# Patient Record
Sex: Female | Born: 1985 | Race: Black or African American | Hispanic: No | Marital: Married | Smoking: Former smoker
Health system: Southern US, Community
[De-identification: ages and names within clinical notes are randomized; demographics above are authoritative.]

## PROBLEM LIST (undated history)

## (undated) ENCOUNTER — Inpatient Hospital Stay (HOSPITAL_COMMUNITY): Payer: Self-pay

## (undated) DIAGNOSIS — N390 Urinary tract infection, site not specified: Secondary | ICD-10-CM

## (undated) DIAGNOSIS — R011 Cardiac murmur, unspecified: Secondary | ICD-10-CM

## (undated) DIAGNOSIS — E559 Vitamin D deficiency, unspecified: Secondary | ICD-10-CM

## (undated) DIAGNOSIS — A749 Chlamydial infection, unspecified: Secondary | ICD-10-CM

## (undated) HISTORY — PX: SALPINGECTOMY: SHX328

---
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
## 2006-01-11 ENCOUNTER — Ambulatory Visit (HOSPITAL_COMMUNITY): Admission: RE | Admit: 2006-01-11 | Discharge: 2006-01-11 | Payer: Self-pay | Admitting: Family Medicine

## 2006-01-18 ENCOUNTER — Ambulatory Visit (HOSPITAL_COMMUNITY): Admission: RE | Admit: 2006-01-18 | Discharge: 2006-01-18 | Payer: Self-pay | Admitting: Family Medicine

## 2006-05-20 ENCOUNTER — Inpatient Hospital Stay (HOSPITAL_COMMUNITY): Admission: AD | Admit: 2006-05-20 | Discharge: 2006-05-20 | Payer: Self-pay | Admitting: Obstetrics and Gynecology

## 2006-06-17 ENCOUNTER — Inpatient Hospital Stay (HOSPITAL_COMMUNITY): Admission: AD | Admit: 2006-06-17 | Discharge: 2006-06-17 | Payer: Self-pay | Admitting: Obstetrics and Gynecology

## 2006-08-06 ENCOUNTER — Inpatient Hospital Stay (HOSPITAL_COMMUNITY): Admission: AD | Admit: 2006-08-06 | Discharge: 2006-08-06 | Payer: Self-pay | Admitting: Obstetrics and Gynecology

## 2006-08-10 ENCOUNTER — Inpatient Hospital Stay (HOSPITAL_COMMUNITY): Admission: AD | Admit: 2006-08-10 | Discharge: 2006-08-10 | Payer: Self-pay | Admitting: Obstetrics and Gynecology

## 2006-08-12 ENCOUNTER — Inpatient Hospital Stay (HOSPITAL_COMMUNITY): Admission: AD | Admit: 2006-08-12 | Discharge: 2006-08-12 | Payer: Self-pay | Admitting: Obstetrics and Gynecology

## 2006-08-23 ENCOUNTER — Inpatient Hospital Stay (HOSPITAL_COMMUNITY): Admission: AD | Admit: 2006-08-23 | Discharge: 2006-08-24 | Payer: Self-pay | Admitting: Obstetrics and Gynecology

## 2006-09-04 ENCOUNTER — Inpatient Hospital Stay (HOSPITAL_COMMUNITY): Admission: AD | Admit: 2006-09-04 | Discharge: 2006-09-08 | Payer: Self-pay | Admitting: Obstetrics and Gynecology

## 2006-09-05 ENCOUNTER — Encounter (INDEPENDENT_AMBULATORY_CARE_PROVIDER_SITE_OTHER): Payer: Self-pay | Admitting: Specialist

## 2010-04-29 ENCOUNTER — Inpatient Hospital Stay (HOSPITAL_COMMUNITY): Admission: AD | Admit: 2010-04-29 | Discharge: 2010-04-29 | Payer: Self-pay | Admitting: Obstetrics & Gynecology

## 2010-04-29 ENCOUNTER — Ambulatory Visit: Payer: Self-pay | Admitting: Nurse Practitioner

## 2010-07-08 ENCOUNTER — Ambulatory Visit: Payer: Self-pay | Admitting: Nurse Practitioner

## 2010-07-08 ENCOUNTER — Inpatient Hospital Stay (HOSPITAL_COMMUNITY): Admission: AD | Admit: 2010-07-08 | Discharge: 2010-07-08 | Payer: Self-pay | Admitting: Family Medicine

## 2010-09-03 ENCOUNTER — Ambulatory Visit: Payer: Self-pay | Admitting: Obstetrics and Gynecology

## 2010-09-03 ENCOUNTER — Inpatient Hospital Stay (HOSPITAL_COMMUNITY): Admission: AD | Admit: 2010-09-03 | Discharge: 2010-09-03 | Payer: Self-pay | Admitting: Obstetrics & Gynecology

## 2010-12-24 HISTORY — PX: LAPAROSCOPY FOR ECTOPIC PREGNANCY: SUR765

## 2010-12-31 ENCOUNTER — Inpatient Hospital Stay (HOSPITAL_COMMUNITY)
Admission: AD | Admit: 2010-12-31 | Discharge: 2010-12-31 | Disposition: A | Discharge status: Home / Self Care | Payer: Self-pay | Source: Ambulatory Visit | Attending: Obstetrics and Gynecology | Admitting: Obstetrics and Gynecology

## 2010-12-31 ENCOUNTER — Inpatient Hospital Stay (HOSPITAL_COMMUNITY): Payer: Medicaid Other

## 2010-12-31 ENCOUNTER — Inpatient Hospital Stay (HOSPITAL_COMMUNITY): Admit: 2010-12-31 | Payer: Self-pay | Admitting: Obstetrics & Gynecology

## 2010-12-31 ENCOUNTER — Encounter (HOSPITAL_COMMUNITY): Payer: Self-pay | Admitting: Radiology

## 2010-12-31 DIAGNOSIS — R109 Unspecified abdominal pain: Secondary | ICD-10-CM | POA: Insufficient documentation

## 2010-12-31 DIAGNOSIS — N390 Urinary tract infection, site not specified: Secondary | ICD-10-CM | POA: Insufficient documentation

## 2010-12-31 DIAGNOSIS — O2390 Unspecified genitourinary tract infection in pregnancy, unspecified trimester: Secondary | ICD-10-CM

## 2010-12-31 LAB — URINALYSIS, ROUTINE W REFLEX MICROSCOPIC
Hgb urine dipstick: NEGATIVE
Leukocytes, UA: NEGATIVE
Protein, ur: NEGATIVE mg/dL
Specific Gravity, Urine: 1.01 (ref 1.005–1.030)
Urobilinogen, UA: 0.2 mg/dL (ref 0.0–1.0)

## 2010-12-31 LAB — CBC
HCT: 39.6 % (ref 36.0–46.0)
Hemoglobin: 13 g/dL (ref 12.0–15.0)
MCH: 30.6 pg (ref 26.0–34.0)
MCHC: 32.8 g/dL (ref 30.0–36.0)
MCV: 93.2 fL (ref 78.0–100.0)
RDW: 12.5 % (ref 11.5–15.5)

## 2010-12-31 LAB — WET PREP, GENITAL

## 2010-12-31 LAB — URINE MICROSCOPIC-ADD ON

## 2011-01-01 LAB — GC/CHLAMYDIA PROBE AMP, GENITAL
Chlamydia, DNA Probe: NEGATIVE
GC Probe Amp, Genital: NEGATIVE

## 2011-01-02 ENCOUNTER — Inpatient Hospital Stay (HOSPITAL_COMMUNITY)
Admission: AD | Admit: 2011-01-02 | Discharge: 2011-01-02 | Disposition: A | Discharge status: Home / Self Care | Payer: Self-pay | Source: Ambulatory Visit | Attending: Obstetrics and Gynecology | Admitting: Obstetrics and Gynecology

## 2011-01-02 DIAGNOSIS — R109 Unspecified abdominal pain: Secondary | ICD-10-CM

## 2011-01-02 DIAGNOSIS — O9989 Other specified diseases and conditions complicating pregnancy, childbirth and the puerperium: Secondary | ICD-10-CM

## 2011-01-02 DIAGNOSIS — O99891 Other specified diseases and conditions complicating pregnancy: Secondary | ICD-10-CM | POA: Insufficient documentation

## 2011-01-02 LAB — URINE CULTURE

## 2011-01-09 ENCOUNTER — Ambulatory Visit (HOSPITAL_COMMUNITY)
Admit: 2011-01-09 | Discharge: 2011-01-09 | Disposition: A | Discharge status: Home / Self Care | Payer: Medicaid Other | Attending: Obstetrics and Gynecology | Admitting: Obstetrics and Gynecology

## 2011-01-09 ENCOUNTER — Other Ambulatory Visit: Payer: Self-pay | Admitting: Obstetrics and Gynecology

## 2011-01-09 ENCOUNTER — Ambulatory Visit (HOSPITAL_COMMUNITY)
Admission: AD | Admit: 2011-01-09 | Discharge: 2011-01-09 | Disposition: A | Discharge status: Home / Self Care | Payer: Self-pay | Source: Ambulatory Visit | Attending: Obstetrics and Gynecology | Admitting: Obstetrics and Gynecology

## 2011-01-09 DIAGNOSIS — O00109 Unspecified tubal pregnancy without intrauterine pregnancy: Secondary | ICD-10-CM

## 2011-01-09 DIAGNOSIS — Z3689 Encounter for other specified antenatal screening: Secondary | ICD-10-CM | POA: Insufficient documentation

## 2011-01-09 LAB — CBC
HCT: 38.5 % (ref 36.0–46.0)
Hemoglobin: 12.4 g/dL (ref 12.0–15.0)
MCH: 30.1 pg (ref 26.0–34.0)
MCV: 93.4 fL (ref 78.0–100.0)
Platelets: 232 10*3/uL (ref 150–400)
RBC: 4.12 MIL/uL (ref 3.87–5.11)
WBC: 5 10*3/uL (ref 4.0–10.5)

## 2011-01-09 LAB — HCG, QUANTITATIVE, PREGNANCY: hCG, Beta Chain, Quant, S: 11906 m[IU]/mL — ABNORMAL HIGH (ref <5)

## 2011-01-09 LAB — TYPE AND SCREEN

## 2011-01-12 LAB — RH IMMUNE GLOB WKUP(>/=20WKS)(NOT WOMEN'S HOSP)

## 2011-01-20 NOTE — Op Note (Signed)
[name], [name]             ACCOUNT NO.:  [id number]  MEDICAL RECORD NO.:  [id number]           PATIENT TYPE:  LOCATION:                                 FACILITY:  PHYSICIAN:  Horton Chin, MD DATE OF BIRTH:  27-Oct-1986  DATE OF PROCEDURE:  01/09/2011 DATE OF DISCHARGE:                              OPERATIVE REPORT   PREOPERATIVE DIAGNOSIS:  Viable tubal ectopic pregnancy at 6 weeks' gestation.  POSTOPERATIVE DIAGNOSIS:  Viable tubal ectopic pregnancy at 6 weeks' gestation.  PROCEDURE:  Laparoscopic right salpingectomy and removal of ectopic pregnancy.  SURGEON:  Horton Chin, MD  ANESTHESIOLOGIST:  Angelica Pou, MD  ANESTHESIA:  General.  IV FLUIDS:  1800 mL of lactated Ringer.  ESTIMATED BLOOD LOSS:  Minimal.  URINE OUTPUT:  400 mL.  INDICATIONS:  The patient is a 26 year old gravida 2, para 1-0-0-2, who has been followed for abdominal pain in early pregnancy.  The patient had normally rising beta-hCG trend, but a followup ultrasound that was scheduled today showed a 6-week sized tubal ectopic pregnancy with positive cardiac activity.  No intrauterine pregnancy was visualized. Given this finding, the patient was counseled regarding needing laparoscopic removal of the ectopic pregnancy.  The risks of the procedure were explained to the patient including but not limited to: bleeding, infection, injury to surrounding organs, need for additional procedures including laparotomy, increased risk of ectopic gestation without pregnancies, and a written informed consent was obtained.  FINDINGS:  Right fallopian tube ectopic pregnancy.  Normal left adnexa and uterus.  SPECIMENS:  Right fallopian tube containing ectopic pregnancy.  DISPOSITION OF SPECIMENS:  To pathology.  COMPLICATIONS:  None immediate.  PROCEDURE DETAILS:  The patient was taken to the operating room where general analgesia was administered and found to be adequate.  She  also had sequential compression devices applied to her lower extremities prior to induction of anesthesia.  After the anesthesia was found to be adequate, she was placed in the dorsal lithotomy position and prepped and draped in a sterile manner.  A Foley catheter was inserted into the patient's bladder and attached to constant gravity and a Hulka uterine manipulator was placed.  After an adequate time-out was performed, attention was turned to the patient's abdomen where an umbilical incision was made with a scalpel and a Veress needle was introduced into the peritoneal cavity.  Correct intraperitoneal placement was confirmed with a low opening pressure of 2 mmHg upon insufflation with carbon dioxide gas.  The abdomen was insufflated to 15 mmHg.  The Veress needle was removed, and an 11-mm trocar was then introduced through the umbilical incision.  A laparoscope was then used at this point to survey the abdomen and the pelvis. The upper abdomen was also surveyed, found to be normal, and the pelvis was significant for the right-sided ectopic pregnancy which was noted.  This ectopic pregnancy was noted to be close to the cornu on the right side and she had a normal left fallopian tube and normal ovaries bilaterally.  At this point, bilateral lower quadrant ports were placed, which were both 5-mm ports, and using  Gyrus instrument, the  right fallopian tube was coagulated and ligated from the uterine attachment and also from the underlying mesosalpinx. An EndoCatch bag was then placed and this was used to remove the tube from the abdomen.  There was good hemostasis.  The abdomen was then desufflated and all trocars were removed.  The umbilical incision fascia was reapproximated using 0 Vicryl and the fascial incision of the lower quadrant ports were reapproximated with 0 Vicryl.  All skin incisions were closed with Dermabond.  The patient tolerated the procedure well.  Sponge, instrument,  and needle counts were correct x2. She was taken to the recovery room awake, extubated, and in stable condition.  DISPOSITION:  The patient has been scheduled to follow up in the gynecologic clinic on January 23, 2011, at 9:30 a.m.  She was given routine laparoscopy discharge instructions and was also given a note for work to be excused from work until January 19, 2011, as per the patient's request.  The patient was also given prescriptions for Percocet, ibuprofen, and Colac, 30 tablets each, and also a prescription for ciprofloxacin 500 mg p.o. b.i.d. for 3 days as a culture that was done on one of the patient's prior MAU visits was positive for over 100,000 of E. coli that was pansensitive and also very sensitive to fluoroquinolones.  The patient was advised on how to take these medications and encouraged to finish her antibiotic regimen.  She was told to call the clinic or come to the MAU with any further concerns.     Horton Chin, MD     UAA/MEDQ  D:  01/09/2011  T:  01/10/2011  Job:  [id number]  Electronically Signed by Jaynie Collins MD on 01/20/2011 06:33:18 PM

## 2011-01-23 ENCOUNTER — Encounter: Payer: Medicaid Other | Admitting: Obstetrics & Gynecology

## 2011-02-04 LAB — INFLUENZA PANEL BY PCR (TYPE A & B)
Influenza A By PCR: NEGATIVE
Influenza B By PCR: NEGATIVE

## 2011-02-06 LAB — WET PREP, GENITAL
Trich, Wet Prep: NONE SEEN
Yeast Wet Prep HPF POC: NONE SEEN

## 2011-02-06 LAB — URINALYSIS, ROUTINE W REFLEX MICROSCOPIC
Bilirubin Urine: NEGATIVE
Nitrite: NEGATIVE
Protein, ur: NEGATIVE mg/dL
Urobilinogen, UA: 0.2 mg/dL (ref 0.0–1.0)

## 2011-02-06 LAB — POCT PREGNANCY, URINE: Preg Test, Ur: NEGATIVE

## 2011-02-06 LAB — GC/CHLAMYDIA PROBE AMP, GENITAL
Chlamydia, DNA Probe: NEGATIVE
GC Probe Amp, Genital: NEGATIVE

## 2011-02-09 LAB — URINALYSIS, ROUTINE W REFLEX MICROSCOPIC
Nitrite: NEGATIVE
Specific Gravity, Urine: 1.02 (ref 1.005–1.030)
pH: 7.5 (ref 5.0–8.0)

## 2011-02-09 LAB — URINE CULTURE

## 2011-02-09 LAB — GC/CHLAMYDIA PROBE AMP, GENITAL
Chlamydia, DNA Probe: NEGATIVE
GC Probe Amp, Genital: NEGATIVE

## 2011-02-09 LAB — POCT PREGNANCY, URINE: Preg Test, Ur: NEGATIVE

## 2011-02-26 ENCOUNTER — Encounter: Payer: Medicaid Other | Admitting: Obstetrics & Gynecology

## 2011-03-20 ENCOUNTER — Emergency Department (HOSPITAL_COMMUNITY)
Admission: EM | Admit: 2011-03-20 | Discharge: 2011-03-20 | Disposition: A | Discharge status: Home / Self Care | Payer: Medicaid Other | Attending: Emergency Medicine | Admitting: Emergency Medicine

## 2011-03-20 DIAGNOSIS — R51 Headache: Secondary | ICD-10-CM | POA: Insufficient documentation

## 2011-03-20 DIAGNOSIS — R599 Enlarged lymph nodes, unspecified: Secondary | ICD-10-CM | POA: Insufficient documentation

## 2011-03-20 DIAGNOSIS — J029 Acute pharyngitis, unspecified: Secondary | ICD-10-CM | POA: Insufficient documentation

## 2011-03-20 DIAGNOSIS — J3489 Other specified disorders of nose and nasal sinuses: Secondary | ICD-10-CM | POA: Insufficient documentation

## 2011-03-20 LAB — RAPID STREP SCREEN (MED CTR MEBANE ONLY): Streptococcus, Group A Screen (Direct): NEGATIVE

## 2011-04-10 NOTE — Op Note (Signed)
[name], [name]             ACCOUNT NO.:  [id number]   MEDICAL RECORD NO.:  [id number]          PATIENT TYPE:  INP   LOCATION:  9130                          [facility]ILITY:  [facility]   PHYSICIAN:  Janine Limbo, M.D.DATE OF BIRTH:  1986-01-26   DATE OF PROCEDURE:  DATE OF DISCHARGE:                                 OPERATIVE REPORT   PREOPERATIVE DIAGNOSES:  1. Twin gestation.  2. 38 weeks' gestation.  3. Failure to progress in labor.   POSTOPERATIVE DIAGNOSES:  1. Twin gestation.  2. 38 weeks' gestation.  3. Failure to progress in labor.  4. Occiput posterior presentation.   PROCEDURE:  Primary low transverse cesarean section.   SURGEON:  Dr. Leonard Schwartz.   FIRST ASSISTANT:  [name], certified nurse midwife.   ANESTHETIC:  Epidural.   DISPOSITION:  [name] is a 25 year old female, gravida 1, para 0, who  was admitted on 09/04/2006 with a twin gestation.  She was at [redacted] weeks  gestation.  Her labor did not progress on first day of induction and then  her membranes were ruptured on the morning of delivery when she was 3 cm  dilated.  She had contractions throughout the day but did not progress her  cervix.  After discussing options for a third day of induction the patient  elected to proceed with cesarean section.  The risk and benefits of each of  the options were reviewed with the patient prior to her making her decision  to proceed with cesarean section.  The specific risks of cesarean section  were outlined including, but not limited to, anesthetic complications,  bleeding, infection, and possible damage to surrounding organs.   FINDINGS:  The first infant delivered was a female that weighed 6 pounds 13  ounces.  His name is [name].  He was in an occiput posterior presentation.  The Apgars were 8 at one minute and 9 at five minutes.  The second infant  delivered was a female that was delivered from an occiput anterior  presentation.  He was 6 pounds 3  ounces boy named [name].  The apparatus for  8 at one minute and 9 at five minutes.  The uterus, fallopian tubes, and  ovaries were normal for the gravid state.   PROCEDURE:  The patient was taken to the operating room where it was  determined that the epidural she had received for labor would be adequate  for cesarean section.  The patient had previously had a Foley catheter  placed.  The patient's abdomen was prepped with multiple layers of Betadine  and then sterilely draped.  The lower abdomen was injected with 10 mL of  half percent Marcaine with epinephrine.  A low transverse incision was made  in the abdomen and carried sharply through the subcutaneous tissue, fascia,  and the anterior peritoneum.  The bladder flap was developed.  An incision  was made in the lower uterine segment and extended in a low transverse  fashion.  The first fetal head was delivered without difficulty.  The mouth  and nose were suctioned.  The remainder of  the infant was delivered.  The  cord was clamped and cut and infant was handed to the awaiting pediatric  team.  Routine cord blood studies were obtained.  An identical procedure was  carried out for the second infant.  The cord was clamped and cut and the  infant was handed to the waiting pediatric team.  The placenta was removed.  The uterus was cleaned of amniotic fluid, clotted blood, and membranes.  The  uterine incision was then closed using a running locking suture of 2-0  Vicryl followed by an imbricating suture of 2-0 Vicryl.  Hemostasis was  adequate.  The pelvis was vigorously irrigated.  The anterior peritoneum and  abdominal musculature were reapproximated in the midline using 2-0 Vicryl.  The fascia was closed using a running suture of 0 Vicryl followed by three  interrupted sutures of 0 Vicryl.  The subcutaneous layer was irrigated.  The  subcutaneous layer was closed using a running suture of 0 Vicryl.  The skin  was reapproximated using a  subcuticular suture of 3-0 Monocryl.  Sponge,  needle, instrument counts were correct on two occasions.  Estimated blood  loss for the procedure was 800 mL.  The patient tolerated her procedure  well.  She was taken to the recovery room in stable condition.  Both infants  were taken to the full-term nursery in stable condition.  The placenta was  sent to pathology for evaluation.      Janine Limbo, M.D.  Electronically Signed     AVS/MEDQ  D:  09/05/2006  T:  09/06/2006  Job:  [id number]
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
## 2011-04-10 NOTE — Discharge Summary (Signed)
[name], [name]             ACCOUNT NO.:  [id number]   MEDICAL RECORD NO.:  [id number]          PATIENT TYPE:  INP   LOCATION:  9130                          FACILITY:  [facility]   PHYSICIAN:  Hal Morales, M.D.DATE OF BIRTH:  1986-11-08   DATE OF ADMISSION:  09/04/2006  DATE OF DISCHARGE:  09/08/2006                                 DISCHARGE SUMMARY   ADMITTING DIAGNOSIS:  Twin intrauterine pregnancy at 38-6/7 week.   DISCHARGE DIAGNOSES:  1. Twin intrauterine pregnancy at 39 weeks delivered.  2. Primary low transverse cesarean section.  3. Postoperative anemia without evidence of hemodynamic instability.   PROCEDURES:  Primary low transverse cesarean section.   HOSPITAL COURSE:  [name] is a 25 year old gravida 1, para 0 who was  admitted at 38-6/[redacted] weeks gestation with twin pregnancy for induction of  labor. The patient's pregnancy remarkable for:   1. Adolescence.  2. Twin.  3. Patient with a history of a heart murmur.  4. Positive Chlamydia first trimester with negative test of cure.  5. Rh negative.  6. Group B strep negative.   The patient's labor was induced. High dose of Pitocin protocol was started  on the day of admission and was continued throughout the day of admission.  On the evening of October 13, the day of admission, Pitocin was discontinued  and Cytotec was placed for cervical ripening. The following morning on  October 14 Pitocin was resumed. The patient with small amount of cervical  change noted. Patient with minimal cervical change throughout the day of  September 05, 2006. At that time the patient requested primary low transverse  cesarean section due to no cervical change. At that time the risks,  benefits, and alternatives of cesarean section were discussed with the  patient by  Dr. Stefano Gaul, and she elected to proceed. The patient underwent a primary  low transverse cesarean section and delivered twin infants. Twin A 6 pounds  13 ounces, a  female, [name], with Apgars of 8 at 1 minute and 9 at 5 minutes.  Twin B 6 pounds 3 ounces, female infant [name], with Apgars of 8 at 1 minute  and 9 at 5 minutes. Estimated blood loss 800 cc. The patient is breast  feeding. Hemoglobin was 5.6 on postoperative day #1. However, patient was  asymptomatic with no evidence of hemodynamic instability. The patient was  started on iron supplementation on postoperative day #1. The patient with  orthostatic vital signs without abnormality on postoperative day #1. The  risks, benefits, and alternatives of blood transfusion were discussed with  the patient, and she declined blood transfusion. Repeat blood count obtained  on postoperative day #1 at mid afternoon and hemoglobin stable at 5.6. On  the evening of postoperative day #2 the patient experienced episodes of  chest tightness but no distinct shortness of breath. The patient evaluated  with EKG which was within normal limits as well as cardiac enzymes which  were within normal limits. It was felt that perhaps this episode was due  more to anxiety as the patient was tearful and stated that she felt  overwhelmed with the care of twins. On postoperative day #3 the patient  denied any further chest tightness or shortness of breath. The patient also  continued to deny syncopal symptoms. It was felt that she had received the  full benefit of her hospital stay and discharged home. The patient plans use  of Implanon for contraception after her six week visit.   DISCHARGE CONDITION:  Stable.   DISCHARGE INSTRUCTIONS:  Per [facility] handout.   DISCHARGE MEDICATIONS:  1. Motrin 600 mg p.o. q.6 h. p.r.n. pain.  2. Tylox 1-2 tabs p.o. q. 4-6 hours p.r.n. pain.  3. Prenatal vitamins one tablet daily.  4. Tandem F one tablet twice daily.   DISCHARGE FOLLOWUP:  Will occur at six weeks postpartum at [facility]  OB/GYN.      Rhona Leavens, CNM      Hal Morales, M.D.   Electronically Signed    NOS/MEDQ  D:  09/08/2006  T:  09/09/2006  Job:  [id number]

## 2011-04-23 ENCOUNTER — Inpatient Hospital Stay (HOSPITAL_COMMUNITY)
Admission: AD | Admit: 2011-04-23 | Discharge: 2011-04-23 | Disposition: A | Discharge status: Home / Self Care | Payer: Medicaid Other | Source: Ambulatory Visit | Attending: Obstetrics and Gynecology | Admitting: Obstetrics and Gynecology

## 2011-04-23 DIAGNOSIS — N949 Unspecified condition associated with female genital organs and menstrual cycle: Secondary | ICD-10-CM

## 2011-04-23 DIAGNOSIS — R109 Unspecified abdominal pain: Secondary | ICD-10-CM | POA: Insufficient documentation

## 2011-04-23 LAB — CBC
Hemoglobin: 12.5 g/dL (ref 12.0–15.0)
MCH: 30.6 pg (ref 26.0–34.0)
RBC: 4.08 MIL/uL (ref 3.87–5.11)

## 2011-04-23 LAB — URINALYSIS, ROUTINE W REFLEX MICROSCOPIC
Bilirubin Urine: NEGATIVE
Ketones, ur: NEGATIVE mg/dL
Nitrite: NEGATIVE
Protein, ur: NEGATIVE mg/dL
pH: 7 (ref 5.0–8.0)

## 2011-04-23 LAB — POCT PREGNANCY, URINE: Preg Test, Ur: NEGATIVE

## 2011-04-23 LAB — WET PREP, GENITAL

## 2011-04-23 LAB — HCG, QUANTITATIVE, PREGNANCY: hCG, Beta Chain, Quant, S: 1 m[IU]/mL (ref <5)

## 2011-04-24 LAB — GC/CHLAMYDIA PROBE AMP, GENITAL: GC Probe Amp, Genital: NEGATIVE

## 2011-11-05 ENCOUNTER — Encounter (HOSPITAL_COMMUNITY): Payer: Self-pay | Admitting: *Deleted

## 2011-11-05 ENCOUNTER — Inpatient Hospital Stay (HOSPITAL_COMMUNITY)
Admission: AD | Admit: 2011-11-05 | Discharge: 2011-11-06 | Disposition: A | Discharge status: Home / Self Care | Payer: Medicaid Other | Source: Ambulatory Visit | Attending: Obstetrics & Gynecology | Admitting: Obstetrics & Gynecology

## 2011-11-05 DIAGNOSIS — R109 Unspecified abdominal pain: Secondary | ICD-10-CM | POA: Insufficient documentation

## 2011-11-05 LAB — URINALYSIS, ROUTINE W REFLEX MICROSCOPIC
Glucose, UA: NEGATIVE mg/dL
Leukocytes, UA: NEGATIVE
Protein, ur: NEGATIVE mg/dL
Urobilinogen, UA: 1 mg/dL (ref 0.0–1.0)

## 2011-11-05 LAB — POCT PREGNANCY, URINE: Preg Test, Ur: NEGATIVE

## 2011-11-05 NOTE — Progress Notes (Signed)
Not in lobby

## 2011-11-05 NOTE — Progress Notes (Signed)
Pt not in lobby.  

## 2011-11-05 NOTE — Progress Notes (Signed)
PT SAYS HAS HX OF ECTOPIC- - 12-2010  .-  HAD SURGERY- REMOVED R TUBE - SHE THINKS NO BIRTH CONTROL.  DID UPT - TODAY  X2 - POST.    SAYS HER PAIN IS  ALL OVER ABD - LIKE MENST  CRAMP.Marland Kitchen TWINS WERE DEL BY [facility]

## 2011-11-06 ENCOUNTER — Inpatient Hospital Stay (HOSPITAL_COMMUNITY): Payer: Medicaid Other

## 2011-11-06 ENCOUNTER — Encounter (HOSPITAL_COMMUNITY): Payer: Self-pay | Admitting: *Deleted

## 2011-11-06 ENCOUNTER — Inpatient Hospital Stay (HOSPITAL_COMMUNITY)
Admission: AD | Admit: 2011-11-06 | Discharge: 2011-11-06 | Disposition: A | Discharge status: Home / Self Care | Payer: Medicaid Other | Source: Ambulatory Visit | Attending: Obstetrics and Gynecology | Admitting: Obstetrics and Gynecology

## 2011-11-06 DIAGNOSIS — R109 Unspecified abdominal pain: Secondary | ICD-10-CM | POA: Insufficient documentation

## 2011-11-06 DIAGNOSIS — Z3201 Encounter for pregnancy test, result positive: Secondary | ICD-10-CM

## 2011-11-06 DIAGNOSIS — O200 Threatened abortion: Secondary | ICD-10-CM

## 2011-11-06 DIAGNOSIS — O99891 Other specified diseases and conditions complicating pregnancy: Secondary | ICD-10-CM | POA: Insufficient documentation

## 2011-11-06 HISTORY — DX: Urinary tract infection, site not specified: N39.0

## 2011-11-06 HISTORY — DX: Chlamydial infection, unspecified: A74.9

## 2011-11-06 HISTORY — DX: Cardiac murmur, unspecified: R01.1

## 2011-11-06 LAB — WET PREP, GENITAL: Trich, Wet Prep: NONE SEEN

## 2011-11-06 NOTE — ED Provider Notes (Signed)
History     Chief Complaint  Patient presents with  . Dysmenorrhea   HPI This is a 25 y.o. at Unknown [place] who presents for evaluation of pregnancy. States had an ectopic (with right salpingectomy) in February 2012 (twins last year) and was due for her period yesterday. Developed cramping all over abdomen and feels like heart is beating out of her chest at times.  OB History    Grav Para Term Preterm Abortions TAB SAB Ect Mult Living   3 1 1  0 1 0 0 1 1 2       Past Medical History  Diagnosis Date  . Heart murmur   . Urinary tract infection   . Chlamydia ?2007    Past Surgical History  Procedure Date  . Cesarean section   . Laparoscopy for ectopic pregnancy Feb 2012    Family History  Problem Relation Age of Onset  . Anesthesia problems Neg Hx     History  Substance Use Topics  . Smoking status: Former Games developer  . Smokeless tobacco: Never Used  . Alcohol Use: No    Allergies: No Known Allergies  No prescriptions prior to admission    ROS See above  Physical Exam   Blood pressure 109/56, pulse 78, temperature 98.7 F (37.1 C), temperature source Oral, resp. rate 18, height 5\' 1"  (1.549 m), weight 121 lb 4 oz (54.999 kg), last menstrual period 10/09/2011, SpO2 99.00%, not currently breastfeeding.  Physical Exam  Constitutional: She is oriented to person, place, and time. She appears well-developed and well-nourished. No distress.  HENT:  Head: Normocephalic.  Cardiovascular: Normal rate.   Respiratory: Effort normal.  GI: Soft. She exhibits no distension. There is no tenderness.  Genitourinary: Vagina normal and uterus normal. No vaginal discharge found.  Musculoskeletal: Normal range of motion.  Neurological: She is alert and oriented to person, place, and time.  Skin: Skin is warm and dry.  Psychiatric: She has a normal mood and affect.   Quant reviewed Results for orders placed during the hospital encounter of 11/06/11 (from the past 24 hour(s))  HCG,  QUANTITATIVE, PREGNANCY     Status: Abnormal   Collection Time   11/06/11  8:33 AM      Component Value Range   hCG, Beta Chain, Quant, S 484 (*) <5 (mIU/mL)   Quant TODAY:  1259  [place] reviewed Thickened endometrium. No IUGS seen No fetal pole or Yolk sac. R corpus Luteum cyst  MAU Course  Procedures  Assessment and Plan  A:  Pregnancy      Undetermined whether normal or abnormal P:  Repeat Quant on 12.16/12   [facility] 11/06/2011, 10:13 AM

## 2011-11-06 NOTE — Progress Notes (Signed)
NOT IN LOBBY 

## 2011-11-06 NOTE — Progress Notes (Signed)
Was to get period yesterday.  Started cramping a couple days before.  Has hx of ectopic preg and was concerned.  Hx of heart murmur, has been feeling different lately, like heart is "beating out of chest"  When walking, short of breath, and light headed at times

## 2011-11-06 NOTE — Progress Notes (Signed)
Pt states got up yesterday to get the door, and felt very faint, started shaking, had to sit down. Hx heart murmur. Also having lower abd cramping, rates 3/10. LMP 10/09/2011. Denies bleeding or vag d/c.

## 2011-11-07 LAB — GC/CHLAMYDIA PROBE AMP, GENITAL
Chlamydia, DNA Probe: NEGATIVE
GC Probe Amp, Genital: NEGATIVE

## 2011-11-08 ENCOUNTER — Inpatient Hospital Stay (HOSPITAL_COMMUNITY)
Admission: AD | Admit: 2011-11-08 | Discharge: 2011-11-08 | Disposition: A | Discharge status: Home / Self Care | Payer: Medicaid Other | Source: Ambulatory Visit | Attending: Family Medicine | Admitting: Family Medicine

## 2011-11-08 DIAGNOSIS — O209 Hemorrhage in early pregnancy, unspecified: Secondary | ICD-10-CM | POA: Insufficient documentation

## 2011-11-08 DIAGNOSIS — O200 Threatened abortion: Secondary | ICD-10-CM

## 2011-11-08 MED ORDER — CONCEPT OB 130-92.4-1 MG PO CAPS: 1.0000 | ORAL_CAPSULE | Freq: Every day | ORAL | Status: DC

## 2011-11-08 NOTE — Progress Notes (Signed)
Denies pain or bleeding here for repeat BHCG

## 2011-11-08 NOTE — ED Provider Notes (Signed)
History   Chief Complaint:  Possible Pregnancy   [name] is  25 y.o. [id number] Patient's last menstrual period was 10/09/2011.Marland Kitchen Patient is here for follow up of quantitative HCG and ongoing surveillance of pregnancy status.   She is 4.[redacted]  weeks gestation  by LMP.    Since her last visit, the patient is without new complaint.   The patient reports bleeding as  none now.    General ROS:  negative  Her previous Quantitative HCG values are: 11/06/11 484    Physical Exam   Blood pressure 111/52, pulse 85, temperature 98.4 F (36.9 C), resp. rate 16, last menstrual period 10/09/2011, not currently breastfeeding.  Focused Gynecological Exam: examination not indicated  Labs: Recent Results (from the past 24 hour(s))  HCG, QUANTITATIVE, PREGNANCY   Collection Time   11/08/11 12:27 PM      Component Value Range   hCG, Beta Chain, Quant, S 1259 (*) <5 (mIU/mL)    Ultrasound Studies:    Assessment: Early pregnancy w/ appropriately rising quants. Hx ectpoic  Plan: Repeat quant in 2 days.  Ectopic precautions  Braelen Sproule 11/08/2011, 1:35 PM

## 2011-11-10 ENCOUNTER — Encounter (HOSPITAL_COMMUNITY): Payer: Self-pay

## 2011-11-10 ENCOUNTER — Inpatient Hospital Stay (HOSPITAL_COMMUNITY): Payer: Medicaid Other

## 2011-11-10 ENCOUNTER — Inpatient Hospital Stay (HOSPITAL_COMMUNITY)
Admission: AD | Admit: 2011-11-10 | Discharge: 2011-11-10 | Disposition: A | Discharge status: Home / Self Care | Payer: Medicaid Other | Source: Ambulatory Visit | Attending: Obstetrics & Gynecology | Admitting: Obstetrics & Gynecology

## 2011-11-10 DIAGNOSIS — R109 Unspecified abdominal pain: Secondary | ICD-10-CM | POA: Insufficient documentation

## 2011-11-10 DIAGNOSIS — O99891 Other specified diseases and conditions complicating pregnancy: Secondary | ICD-10-CM | POA: Insufficient documentation

## 2011-11-10 DIAGNOSIS — O26899 Other specified pregnancy related conditions, unspecified trimester: Secondary | ICD-10-CM

## 2011-11-10 NOTE — Progress Notes (Signed)
Pt states here for f/u labs only, denies pain or bleeding.

## 2011-11-10 NOTE — ED Provider Notes (Signed)
History   Pt presents today for f/u B-quant secondary to pos preg test and hx of ectopic preg. Pt denies abd pain or bleeding. She has no complaints at this time.  Chief Complaint  Patient presents with  . Labs Only   HPI  OB History    Grav Para Term Preterm Abortions TAB SAB Ect Mult Living   3 1 1  0 1 0 0 1 1 2       Past Medical History  Diagnosis Date  . Heart murmur   . Urinary tract infection   . Chlamydia ?2007    Past Surgical History  Procedure Date  . Cesarean section   . Laparoscopy for ectopic pregnancy Feb 2012  . Salpingectomy     Family History  Problem Relation Age of Onset  . Anesthesia problems Neg Hx     History  Substance Use Topics  . Smoking status: Former Games developer  . Smokeless tobacco: Never Used  . Alcohol Use: No    Allergies: No Known Allergies  Prescriptions prior to admission  Medication Sig Dispense Refill  . Prenat w/o A Vit-FeFum-FePo-FA (CONCEPT OB) 130-92.4-1 MG CAPS Take 1 tablet by mouth daily.  30 capsule  12    Review of Systems  Constitutional: Negative for fever.  Cardiovascular: Negative for chest pain.  Gastrointestinal: Negative for nausea, vomiting, abdominal pain, diarrhea and constipation.  Genitourinary: Negative for dysuria, urgency, frequency and hematuria.  Neurological: Negative for dizziness and headaches.  Psychiatric/Behavioral: Negative for depression and suicidal ideas.   Physical Exam   Pulse 83, temperature 98.7 F (37.1 C), temperature source Oral, resp. rate 18, height 5\' 1"  (1.549 m), weight 122 lb 1 oz (55.367 kg), last menstrual period 10/09/2011, not currently breastfeeding.  Physical Exam  Nursing note and vitals reviewed. Constitutional: She is oriented to person, place, and time. She appears well-developed and well-nourished. No distress.  HENT:  Head: Normocephalic and atraumatic.  Eyes: EOM are normal. Pupils are equal, round, and reactive to light.  GI: Soft. She exhibits no  distension. There is no tenderness. There is no rebound and no guarding.  Neurological: She is alert and oriented to person, place, and time.  Skin: Skin is warm and dry. She is not diaphoretic.  Psychiatric: She has a normal mood and affect. Her behavior is normal. Judgment and thought content normal.    MAU Course  Procedures  Results for orders placed during the hospital encounter of 11/10/11 (from the past 24 hour(s))  HCG, QUANTITATIVE, PREGNANCY     Status: Abnormal   Collection Time   11/10/11  9:31 AM      Component Value Range   hCG, Beta Chain, Quant, S 2468 (*) <5 (mIU/mL)   US shows fluid collection within the uterus. Probable early IUP although ectopic cannot be ruled out.  Assessment and Plan  Pain in preg: no pain now. Pt will return in 2 days for repeat B-quant. Discussed signs and sx of ectopic preg. Discussed diet, activity, risks, and precautions.  Clinton Gallant. Rice III, DrHSc, MPAS, PA-C  11/10/2011, 12:32 PM   Henrietta Hoover, PA 11/10/11 1236

## 2011-11-10 NOTE — ED Provider Notes (Signed)
Attestation of Attending Supervision of Advanced Practitioner: Evaluation and management procedures were performed by the PA/NP/CNM/OB Fellow under my supervision/collaboration. Chart reviewed, and agree with management and plan.  Jaynie Collins, M.D. 11/10/2011 2:16 PM

## 2011-11-10 NOTE — ED Provider Notes (Signed)
Attestation of Attending Supervision of Advanced Practitioner: Evaluation and management procedures were performed by the PA/NP/CNM/OB Fellow under my supervision/collaboration. Chart reviewed, and agree with management and plan.  Jaynie Collins, M.D. 11/10/2011 9:58 AM

## 2011-11-11 ENCOUNTER — Encounter (HOSPITAL_COMMUNITY): Payer: Self-pay | Admitting: *Deleted

## 2011-11-11 ENCOUNTER — Inpatient Hospital Stay (HOSPITAL_COMMUNITY)
Admission: AD | Admit: 2011-11-11 | Discharge: 2011-11-12 | Disposition: A | Discharge status: Home / Self Care | Payer: Medicaid Other | Source: Ambulatory Visit | Attending: Obstetrics and Gynecology | Admitting: Obstetrics and Gynecology

## 2011-11-11 DIAGNOSIS — O209 Hemorrhage in early pregnancy, unspecified: Secondary | ICD-10-CM

## 2011-11-11 DIAGNOSIS — O26859 Spotting complicating pregnancy, unspecified trimester: Secondary | ICD-10-CM | POA: Insufficient documentation

## 2011-11-11 LAB — CBC
HCT: 36.6 % (ref 36.0–46.0)
MCH: 30.4 pg (ref 26.0–34.0)
MCHC: 32.8 g/dL (ref 30.0–36.0)
MCV: 92.7 fL (ref 78.0–100.0)
Platelets: 256 10*3/uL (ref 150–400)
RDW: 12.6 % (ref 11.5–15.5)
WBC: 7.7 10*3/uL (ref 4.0–10.5)

## 2011-11-11 MED ORDER — RHO D IMMUNE GLOBULIN 1500 UNIT/2ML IJ SOLN
300.0000 ug | Freq: Once | INTRAMUSCULAR | Status: AC
  Administered 2011-11-12: 300 ug via INTRAMUSCULAR
  Filled 2011-11-11: qty 2

## 2011-11-11 MED ORDER — DIPHENHYDRAMINE HCL 25 MG PO CAPS: 25.0000 mg | ORAL_CAPSULE | Freq: Once | ORAL | Status: DC

## 2011-11-11 MED ORDER — HYDROMORPHONE HCL PF 1 MG/ML IJ SOLN: 1.0000 mg | Freq: Once | INTRAMUSCULAR | Status: DC

## 2011-11-11 NOTE — Progress Notes (Signed)
Pt presents to mau for c/o light bleeding.  Noticed it on her underwear and then when wiping. No bleeding on underwear here.  Did notice slight pink when wiping in bathroom here.

## 2011-11-11 NOTE — Progress Notes (Signed)
H. Neese, NP at bedside.  Assessment done and poc discussed with pt.  

## 2011-11-11 NOTE — Progress Notes (Signed)
SSE per NP.  VE done.

## 2011-11-11 NOTE — Progress Notes (Signed)
Pt noticed spots of blood on the tissue when she wiped after using the bthroom-no active bleeding-just on the tissue

## 2011-11-11 NOTE — ED Provider Notes (Signed)
[name] is a 25 y.o. female @ 101w5d weeks gestation who presents to MAU for spotting in early pregnancy. She was evaluated on 11/08/11 and had a Bhcg of 1,259 and ultrasound that showed no IUP. She returned 12/18 and the Bhcg was 2,468 and ultrasound showed a ? Early IUGS. Tonight she returns with spotting. Her blood type is A negative.  Results for orders placed during the hospital encounter of 11/11/11 (from the past 24 hour(s))  CBC     Status: Normal   Collection Time   11/11/11 10:15 PM      Component Value Range   WBC 7.7  4.0 - 10.5 (K/uL)   RBC 3.95  3.87 - 5.11 (MIL/uL)   Hemoglobin 12.0  12.0 - 15.0 (g/dL)   HCT 04.5  40.9 - 81.1 (%)   MCV 92.7  78.0 - 100.0 (fL)   MCH 30.4  26.0 - 34.0 (pg)   MCHC 32.8  30.0 - 36.0 (g/dL)   RDW 91.4  78.2 - 95.6 (%)   Platelets 256  150 - 400 (K/uL)  HCG, QUANTITATIVE, PREGNANCY     Status: Abnormal   Collection Time   11/11/11 10:15 PM      Component Value Range   hCG, Beta Chain, Quant, S 4738 (*) <5 (mIU/mL)   Assessment: Spotting in early pregnancy  Plan:  Discussed patient history and clinical findings with Dr. Emelda Fear   Will give Rhogam   Schedule follow up ultrasound for next week   Patient to continue strict ectopic precautions   Discussed plan of care in detail with the patient and her friend.  [place], [place] 11/11/11 [phone]

## 2011-11-12 NOTE — ED Provider Notes (Signed)
Attestation of Attending Supervision of Advanced Practitioner: Evaluation and management procedures were performed by the PA/NP/CNM/OB Fellow under my supervision/collaboration. Chart reviewed and agree with management and plan.  Mikaila Grunert V 11/12/2011 7:43 AM

## 2011-11-13 LAB — RH IG WORKUP (INCLUDES ABO/RH): Unit division: 0

## 2011-11-17 NOTE — ED Provider Notes (Signed)
Chart reviewed and agree with management and plan.  

## 2011-11-18 ENCOUNTER — Encounter (HOSPITAL_COMMUNITY): Payer: Self-pay

## 2011-11-18 ENCOUNTER — Inpatient Hospital Stay (HOSPITAL_COMMUNITY)
Admission: AD | Admit: 2011-11-18 | Discharge: 2011-11-18 | Disposition: A | Discharge status: Home / Self Care | Payer: Medicaid Other | Source: Ambulatory Visit | Attending: Obstetrics and Gynecology | Admitting: Obstetrics and Gynecology

## 2011-11-18 ENCOUNTER — Inpatient Hospital Stay (HOSPITAL_COMMUNITY): Payer: Medicaid Other

## 2011-11-18 DIAGNOSIS — Z3490 Encounter for supervision of normal pregnancy, unspecified, unspecified trimester: Secondary | ICD-10-CM

## 2011-11-18 DIAGNOSIS — O99891 Other specified diseases and conditions complicating pregnancy: Secondary | ICD-10-CM | POA: Insufficient documentation

## 2011-11-18 DIAGNOSIS — Z1389 Encounter for screening for other disorder: Secondary | ICD-10-CM

## 2011-11-18 NOTE — Progress Notes (Signed)
Pt here for f/u only, denies pain or bleeding.

## 2011-11-18 NOTE — ED Provider Notes (Signed)
History     No chief complaint on file.  HPI [name] 25 y.o. [redacted]w[redacted]d by LMP.  Returns today for ultrasound to document IUP.  Has had appropriately rising quants.   OB History    Grav Para Term Preterm Abortions TAB SAB Ect Mult Living   3 1 1  0 1 0 0 1 1 2       Past Medical History  Diagnosis Date  . Heart murmur   . Urinary tract infection   . Chlamydia ?2007    Past Surgical History  Procedure Date  . Cesarean section   . Laparoscopy for ectopic pregnancy Feb 2012  . Salpingectomy     Family History  Problem Relation Age of Onset  . Anesthesia problems Neg Hx     History  Substance Use Topics  . Smoking status: Former Games developer  . Smokeless tobacco: Never Used  . Alcohol Use: No    Allergies: No Known Allergies  No prescriptions prior to admission    ROS Physical Exam   Blood pressure 115/45, pulse 89, temperature 98.8 F (37.1 C), temperature source Oral, resp. rate 16, height 5\' 2"  (1.575 m), weight 123 lb 6 oz (55.963 kg), last menstrual period 10/09/2011, not currently breastfeeding.  Physical Exam  Nursing note and vitals reviewed. Constitutional: She is oriented to person, place, and time. She appears well-developed and well-nourished.  HENT:  Head: Normocephalic.  Eyes: EOM are normal.  Neck: Neck supple.  Musculoskeletal: Normal range of motion.  Neurological: She is alert and oriented to person, place, and time.  Skin: Skin is warm and dry.  Psychiatric: She has a normal mood and affect.    MAU Course  Procedures  MDM Ultrasound - IUP 5w 5d with FHT of 85.    Assessment and Plan  5w 5d IUP  Plan Keep your appointment with [facility] on 12-13-11 Reviewed ultrasound with client. Advised at this time FHT is low and that may or may not be a normal finding. Return if you have pain or bleeding.  BURLESON,TERRI 11/18/2011, 2:20 PM   [name] Bernheim, NP 11/18/11 1454

## 2011-11-19 NOTE — ED Provider Notes (Signed)
Agree with above note.  [name] 11/19/2011 1:33 PM

## 2011-12-15 LAB — OB RESULTS CONSOLE ABO/RH: RH Type: NEGATIVE

## 2011-12-15 LAB — OB RESULTS CONSOLE HEPATITIS B SURFACE ANTIGEN: Hepatitis B Surface Ag: NEGATIVE

## 2011-12-25 ENCOUNTER — Encounter (HOSPITAL_COMMUNITY): Payer: Self-pay | Admitting: *Deleted

## 2011-12-25 ENCOUNTER — Inpatient Hospital Stay (HOSPITAL_COMMUNITY)
Admission: AD | Admit: 2011-12-25 | Discharge: 2011-12-25 | Disposition: A | Discharge status: Home / Self Care | Payer: Self-pay | Source: Ambulatory Visit | Attending: Obstetrics | Admitting: Obstetrics

## 2011-12-25 DIAGNOSIS — O210 Mild hyperemesis gravidarum: Secondary | ICD-10-CM | POA: Insufficient documentation

## 2011-12-25 DIAGNOSIS — O9989 Other specified diseases and conditions complicating pregnancy, childbirth and the puerperium: Secondary | ICD-10-CM

## 2011-12-25 DIAGNOSIS — R51 Headache: Secondary | ICD-10-CM | POA: Insufficient documentation

## 2011-12-25 DIAGNOSIS — O26899 Other specified pregnancy related conditions, unspecified trimester: Secondary | ICD-10-CM

## 2011-12-25 DIAGNOSIS — O219 Vomiting of pregnancy, unspecified: Secondary | ICD-10-CM

## 2011-12-25 LAB — URINALYSIS, ROUTINE W REFLEX MICROSCOPIC
Glucose, UA: NEGATIVE mg/dL
Ketones, ur: NEGATIVE mg/dL
Leukocytes, UA: NEGATIVE
Protein, ur: NEGATIVE mg/dL
Urobilinogen, UA: 1 mg/dL (ref 0.0–1.0)

## 2011-12-25 LAB — CBC
HCT: 32.5 % — ABNORMAL LOW (ref 36.0–46.0)
MCHC: 33.8 g/dL (ref 30.0–36.0)
Platelets: 242 10*3/uL (ref 150–400)
RDW: 12.4 % (ref 11.5–15.5)
WBC: 6.8 10*3/uL (ref 4.0–10.5)

## 2011-12-25 LAB — COMPREHENSIVE METABOLIC PANEL
ALT: 5 U/L (ref 0–35)
AST: 11 U/L (ref 0–37)
Alkaline Phosphatase: 39 U/L (ref 39–117)
CO2: 23 mEq/L (ref 19–32)
Calcium: 8.7 mg/dL (ref 8.4–10.5)
GFR calc Af Amer: >90 mL/min (ref >90)
Glucose, Bld: 81 mg/dL (ref 70–99)
Potassium: 4 mEq/L (ref 3.5–5.1)
Sodium: 132 mEq/L — ABNORMAL LOW (ref 135–145)
Total Protein: 6.6 g/dL (ref 6.0–8.3)

## 2011-12-25 MED ORDER — SODIUM CHLORIDE 0.9 % IV SOLN
INTRAVENOUS | Status: DC
  Administered 2011-12-25: 15:00:00 via INTRAVENOUS

## 2011-12-25 MED ORDER — DEXAMETHASONE SODIUM PHOSPHATE 10 MG/ML IJ SOLN
10.0000 mg | Freq: Once | INTRAMUSCULAR | Status: AC
  Administered 2011-12-25: 10 mg via INTRAVENOUS
  Filled 2011-12-25: qty 1

## 2011-12-25 MED ORDER — METOCLOPRAMIDE HCL 5 MG/ML IJ SOLN
10.0000 mg | Freq: Once | INTRAMUSCULAR | Status: AC
  Administered 2011-12-25: 10 mg via INTRAVENOUS
  Filled 2011-12-25: qty 2

## 2011-12-25 MED ORDER — DIPHENHYDRAMINE HCL 50 MG/ML IJ SOLN
25.0000 mg | Freq: Once | INTRAMUSCULAR | Status: AC
  Administered 2011-12-25: 25 mg via INTRAVENOUS
  Filled 2011-12-25: qty 1

## 2011-12-25 NOTE — ED Provider Notes (Signed)
History     Chief Complaint  Patient presents with  . Headache   HPIAshley [name] is 26 y.o. [id number] [redacted]w[redacted]d weeks presenting with headache X 4 days.  Was mild initially, treated with Tylenol, worsened yesterday,  Called the office and Rx called in- Fioricet per EMR.    Picked it up this am at 9 but vomited 45 minutes later.  Vomited yesterday yesterday.  Ate breakfast and lunch and kept it down.  Mild cramping.  Denies vaginal bleeding and discharge.  Had same kind of headaches with previous pregnancy.      Past Medical History  Diagnosis Date  . Heart murmur   . Urinary tract infection   . Chlamydia ?2007    Past Surgical History  Procedure Date  . Cesarean section   . Laparoscopy for ectopic pregnancy Feb 2012  . Salpingectomy     Family History  Problem Relation Age of Onset  . Anesthesia problems Neg Hx     History  Substance Use Topics  . Smoking status: Former Games developer  . Smokeless tobacco: Never Used  . Alcohol Use: No    Allergies: No Known Allergies  Prescriptions prior to admission  Medication Sig Dispense Refill  . acetaminophen (TYLENOL) 500 MG tablet Take 500 mg by mouth every 6 (six) hours as needed. For headache.      . butalbital-acetaminophen-caffeine (FIORICET WITH CODEINE) 50-325-40-30 MG per capsule Take 1 capsule by mouth every 4 (four) hours as needed. For headaches.      . Prenatal Vit-Fe Fumarate-FA (PRENATAL MULTIVITAMIN) TABS Take 1 tablet by mouth daily.        Review of Systems  Constitutional: Negative for fever and chills.  Eyes: Positive for photophobia. Negative for blurred vision and double vision.  Respiratory: Negative.   Cardiovascular: Negative.   Gastrointestinal: Positive for nausea and vomiting.  Genitourinary: Negative.   Neurological: Positive for dizziness and headaches.   Physical Exam   Blood pressure 118/40, pulse 94, temperature 99.3 F (37.4 C), temperature source Oral, resp. rate 20, height 5\' 2"  (1.575 m),  weight 129 lb (58.514 kg), last menstrual period 10/09/2011, SpO2 99.00%.  Physical Exam  Constitutional: She is oriented to person, place, and time. She appears well-developed and well-nourished. No distress.  HENT:  Head: Normocephalic.  Neck: Normal range of motion.  Cardiovascular: Normal rate.   Respiratory: Effort normal.  GI: Soft. She exhibits no distension and no mass. There is no tenderness. There is no rebound and no guarding.  Neurological: She is alert and oriented to person, place, and time. No cranial nerve deficit.  Skin: Skin is warm and dry.   Results for orders placed during the hospital encounter of 12/25/11 (from the past 24 hour(s))  URINALYSIS, ROUTINE W REFLEX MICROSCOPIC     Status: Normal   Collection Time   12/25/11 12:40 PM      Component Value Range   Color, Urine YELLOW  YELLOW    APPearance CLEAR  CLEAR    Specific Gravity, Urine 1.020  1.005 - 1.030    pH 6.5  5.0 - 8.0    Glucose, UA NEGATIVE  NEGATIVE (mg/dL)   Hgb urine dipstick NEGATIVE  NEGATIVE    Bilirubin Urine NEGATIVE  NEGATIVE    Ketones, ur NEGATIVE  NEGATIVE (mg/dL)   Protein, ur NEGATIVE  NEGATIVE (mg/dL)   Urobilinogen, UA 1.0  0.0 - 1.0 (mg/dL)   Nitrite NEGATIVE  NEGATIVE    Leukocytes, UA NEGATIVE  NEGATIVE   CBC  Status: Abnormal   Collection Time   12/25/11  1:10 PM      Component Value Range   WBC 6.8  4.0 - 10.5 (K/uL)   RBC 3.61 (*) 3.87 - 5.11 (MIL/uL)   Hemoglobin 11.0 (*) 12.0 - 15.0 (g/dL)   HCT 66.4 (*) 40.3 - 46.0 (%)   MCV 90.0  78.0 - 100.0 (fL)   MCH 30.5  26.0 - 34.0 (pg)   MCHC 33.8  30.0 - 36.0 (g/dL)   RDW 47.4  25.9 - 56.3 (%)   Platelets 242  150 - 400 (K/uL)  COMPREHENSIVE METABOLIC PANEL     Status: Abnormal   Collection Time   12/25/11  1:10 PM      Component Value Range   Sodium 132 (*) 135 - 145 (mEq/L)   Potassium 4.0  3.5 - 5.1 (mEq/L)   Chloride 102  96 - 112 (mEq/L)   CO2 23  19 - 32 (mEq/L)   Glucose, Bld 81  70 - 99 (mg/dL)   BUN 6  6 -  23 (mg/dL)   Creatinine, Ser 8.75 (*) 0.50 - 1.10 (mg/dL)   Calcium 8.7  8.4 - 64.3 (mg/dL)   Total Protein 6.6  6.0 - 8.3 (g/dL)   Albumin 3.3 (*) 3.5 - 5.2 (g/dL)   AST 11  0 - 37 (U/L)   ALT 5  0 - 35 (U/L)   Alkaline Phosphatase 39  39 - 117 (U/L)   Total Bilirubin 0.3  0.3 - 1.2 (mg/dL)   GFR calc non Af Amer >90  >90 (mL/min)   GFR calc Af Amer >90  >90 (mL/min)   MAU Course  Procedures  MDM 13:50 Spoke to Dr. Gaynell Face, who is on call, regarding sxs.  Order given for Cocktail of Benadryl 25mg , Decadron 10mg , and Reglan 10mg  IV.  Informed patient of plan of care.  15:49  Patient has had complete resolution of her headache and her nausea/vomiting with medications.  She is now hungry and wants to be discharged.   Assessment and Plan  A:  Headache and nausea/vomiting in pregnancy   P:  Pt has rx for fioricet at home to use if sxs return Keep appt with Dr. Tamela Oddi and call her if sxs worsen after using medication   KEY,EVE M 12/25/2011, 1:41 PM   Matt Holmes, NP 12/25/11 1601  Matt Holmes, NP 12/25/11 1604

## 2011-12-25 NOTE — Progress Notes (Signed)
Talked to dr yesterday, same HA keeps going on.  Was prescribed medicine, keeps throwing it up, tylenol doesn't work.  Was told if headache continued to come in.

## 2012-01-04 ENCOUNTER — Inpatient Hospital Stay (HOSPITAL_COMMUNITY)
Admission: AD | Admit: 2012-01-04 | Discharge: 2012-01-04 | Disposition: A | Discharge status: Home / Self Care | Payer: Self-pay | Source: Ambulatory Visit | Attending: Obstetrics | Admitting: Obstetrics

## 2012-01-04 DIAGNOSIS — N760 Acute vaginitis: Secondary | ICD-10-CM

## 2012-01-04 DIAGNOSIS — B9689 Other specified bacterial agents as the cause of diseases classified elsewhere: Secondary | ICD-10-CM

## 2012-01-04 DIAGNOSIS — R109 Unspecified abdominal pain: Secondary | ICD-10-CM | POA: Insufficient documentation

## 2012-01-04 DIAGNOSIS — A499 Bacterial infection, unspecified: Secondary | ICD-10-CM

## 2012-01-04 DIAGNOSIS — N949 Unspecified condition associated with female genital organs and menstrual cycle: Secondary | ICD-10-CM

## 2012-01-04 DIAGNOSIS — O2390 Unspecified genitourinary tract infection in pregnancy, unspecified trimester: Secondary | ICD-10-CM | POA: Insufficient documentation

## 2012-01-04 LAB — WET PREP, GENITAL
Trich, Wet Prep: NONE SEEN
Yeast Wet Prep HPF POC: NONE SEEN

## 2012-01-04 LAB — URINALYSIS, ROUTINE W REFLEX MICROSCOPIC
Bilirubin Urine: NEGATIVE
Hgb urine dipstick: NEGATIVE
Ketones, ur: NEGATIVE mg/dL
Nitrite: NEGATIVE
Specific Gravity, Urine: 1.02 (ref 1.005–1.030)
pH: 6 (ref 5.0–8.0)

## 2012-01-04 MED ORDER — ACETAMINOPHEN 325 MG PO TABS
650.0000 mg | ORAL_TABLET | Freq: Once | ORAL | Status: DC
  Filled 2012-01-04: qty 2

## 2012-01-04 MED ORDER — METRONIDAZOLE 500 MG PO TABS: 500.0000 mg | ORAL_TABLET | Freq: Two times a day (BID) | ORAL | Status: AC

## 2012-01-04 NOTE — Progress Notes (Signed)
Vaginal pressure past 3 days, worse when standing and walking. No bleeding.   Some constipation, no other GI problems. Denies any urinary problems.

## 2012-01-04 NOTE — Progress Notes (Signed)
Pt informed of plan of care-following pelvic to wait in [place] for results-verbalized understnading-denies pain-has a small amt of discomfort from pelvic pressure

## 2012-01-04 NOTE — ED Provider Notes (Signed)
History     CSN: [id number]  Arrival date & time 01/04/12  1630   None     Chief Complaint  Patient presents with  . Abdominal Pain    HPI [name] is a 26 y.o. female @ [redacted]w[redacted]d gestation who presents to MAU for pressure in vaginal area. The pain started 3 days ago. Pain is described as constant pressure that increases with walking, standing and sitting straight up. Less pain with lying down. Denies vaginal bleeding or discharge. Denies UTI symptoms. Last sexual intercourse 2 days ago without pain.   Past Medical History  Diagnosis Date  . Heart murmur   . Urinary tract infection   . Chlamydia ?2007    Past Surgical History  Procedure Date  . Cesarean section   . Laparoscopy for ectopic pregnancy Feb 2012  . Salpingectomy     Family History  Problem Relation Age of Onset  . Anesthesia problems Neg Hx     History  Substance Use Topics  . Smoking status: Former Games developer  . Smokeless tobacco: Never Used  . Alcohol Use: No    OB History    Grav Para Term Preterm Abortions TAB SAB Ect Mult Living   3 1 1  0 1 0 0 1 1 2       Review of Systems  Constitutional: Negative for fever, chills, diaphoresis and fatigue.  HENT: Negative for ear pain, congestion, sore throat, facial swelling, neck pain, neck stiffness, dental problem and sinus pressure.   Eyes: Negative for photophobia, pain and discharge.  Respiratory: Negative for cough, chest tightness and wheezing.   Cardiovascular: Negative.   Gastrointestinal: Positive for constipation. Negative for nausea, vomiting, abdominal pain, diarrhea and abdominal distention.  Genitourinary: Positive for vaginal pain. Negative for dysuria, urgency, frequency, flank pain, vaginal bleeding, vaginal discharge and difficulty urinating.  Musculoskeletal: Positive for back pain. Negative for myalgias and gait problem.  Skin: Negative for color change and rash.  Neurological: Positive for headaches. Negative for dizziness, speech  difficulty, weakness, light-headedness and numbness.  Psychiatric/Behavioral: Negative for confusion and agitation. The patient is not nervous/anxious.     Allergies  Review of patient's allergies indicates no known allergies.  Home Medications  No current outpatient prescriptions on file.  BP 119/49  Pulse 91  Temp(Src) 98.4 F (36.9 C) (Oral)  Resp 20  Ht 5\' 2"  (1.575 m)  Wt 130 lb (58.968 kg)  BMI 23.78 kg/m2  SpO2 100%  LMP 10/09/2011  Physical Exam  Nursing note and vitals reviewed. Constitutional: She is oriented to person, place, and time. She appears well-developed and well-nourished.  HENT:  Head: Normocephalic.  Eyes: EOM are normal.  Neck: Neck supple.  Cardiovascular: Normal rate.   Pulmonary/Chest: Effort normal.  Abdominal: Soft.       Mildly tender right side of abdomen with palpation.   Genitourinary:       External genitalia without lesions. Thick white discharge with odor vaginal vault. Cervix closed, thick, no CMT, no adnexal tenderness.  Musculoskeletal: Normal range of motion.  Neurological: She is alert and oriented to person, place, and time. No cranial nerve deficit.  Skin: Skin is warm and dry.  Psychiatric: She has a normal mood and affect. Her behavior is normal. Judgment and thought content normal.    Results for orders placed during the hospital encounter of 01/04/12 (from the past 24 hour(s))  URINALYSIS, ROUTINE W REFLEX MICROSCOPIC     Status: Normal   Collection Time   01/04/12  5:00  PM      Component Value Range   Color, Urine YELLOW  YELLOW    APPearance CLEAR  CLEAR    Specific Gravity, Urine 1.020  1.005 - 1.030    pH 6.0  5.0 - 8.0    Glucose, UA NEGATIVE  NEGATIVE (mg/dL)   Hgb urine dipstick NEGATIVE  NEGATIVE    Bilirubin Urine NEGATIVE  NEGATIVE    Ketones, ur NEGATIVE  NEGATIVE (mg/dL)   Protein, ur NEGATIVE  NEGATIVE (mg/dL)   Urobilinogen, UA 0.2  0.0 - 1.0 (mg/dL)   Nitrite NEGATIVE  NEGATIVE    Leukocytes, UA  NEGATIVE  NEGATIVE   WET PREP, GENITAL     Status: Abnormal   Collection Time   01/04/12  7:45 PM      Component Value Range   Yeast Wet Prep HPF POC NONE SEEN  NONE SEEN    Trich, Wet Prep NONE SEEN  NONE SEEN    Clue Cells Wet Prep HPF POC MODERATE (*) NONE SEEN    WBC, Wet Prep HPF POC FEW (*) NONE SEEN    Assessment: Bacterial vaginosis   Round ligament pain in pregnancy  Plan:  Rx Flagyl   Tylenol as needed for discomfort  ED Course  Procedures  MDM          Kerrie Buffalo, NP 01/04/12 2018

## 2012-01-05 LAB — GC/CHLAMYDIA PROBE AMP, GENITAL: Chlamydia, DNA Probe: NEGATIVE

## 2012-02-01 IMAGING — US US OB COMP LESS 14 WK
1 series · 14 of 28 positions shown · non-contrast
Comparison: none

CLINICAL DATA: Abdominal pain.  Positive urine pregnancy test.

OBSTETRIC <14 WK US AND TRANSVAGINAL OB US
TECHNIQUE: Both transabdominal and transvaginal ultrasound
examinations were performed for complete evaluation of the
gestation as well as the maternal uterus, adnexal regions, and
pelvic cul-de-sac.

[Series 1: us ob comp less 14 wks · 42 acquisitions, 14 frames shown]
[im 2/42]
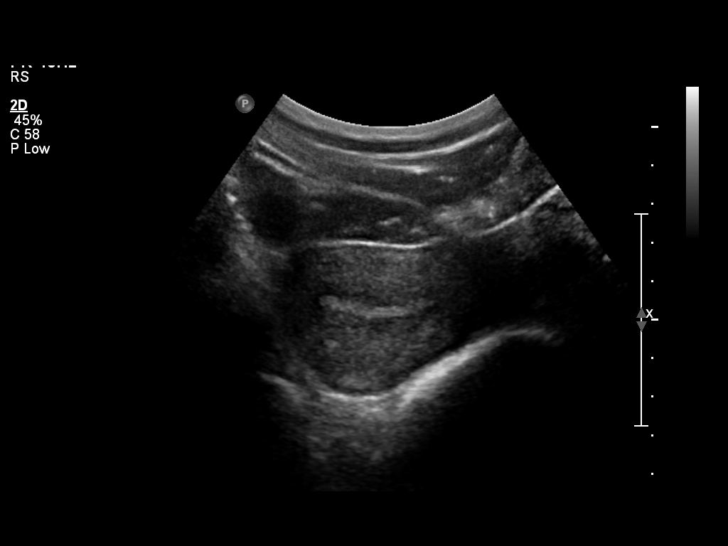
[im 5/42]
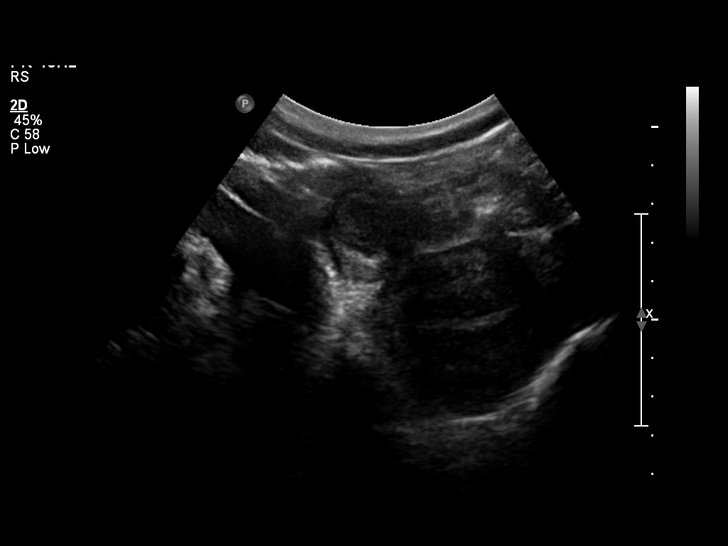
[im 8/42]
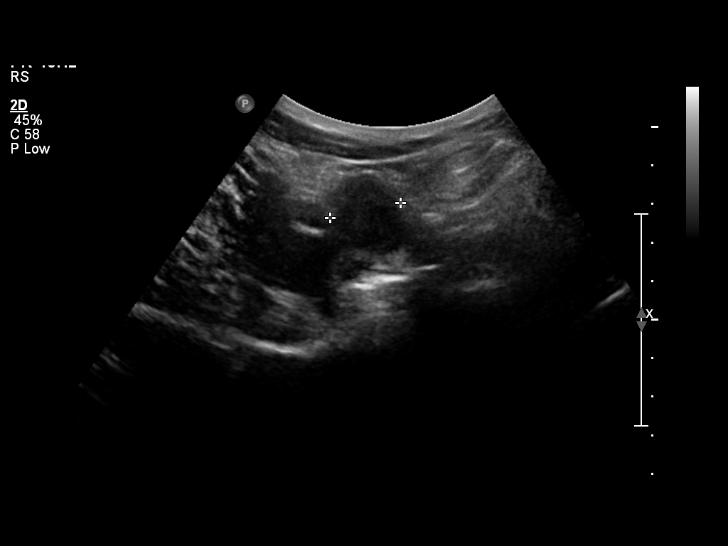
[im 11/42]
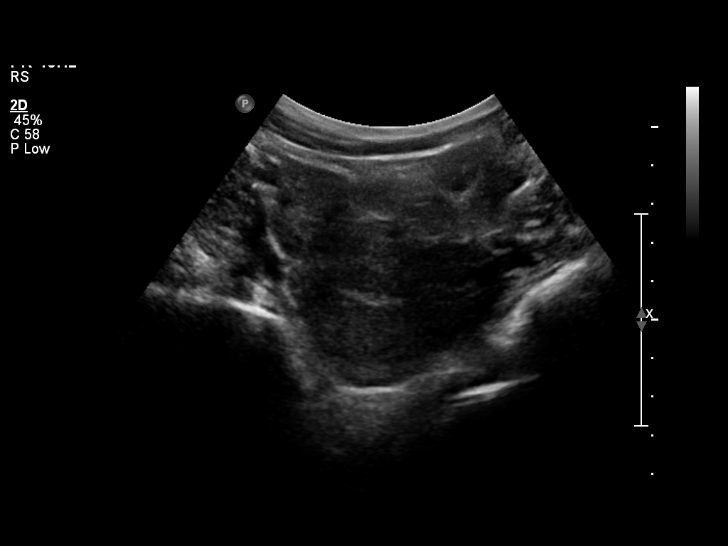
[im 14/42]
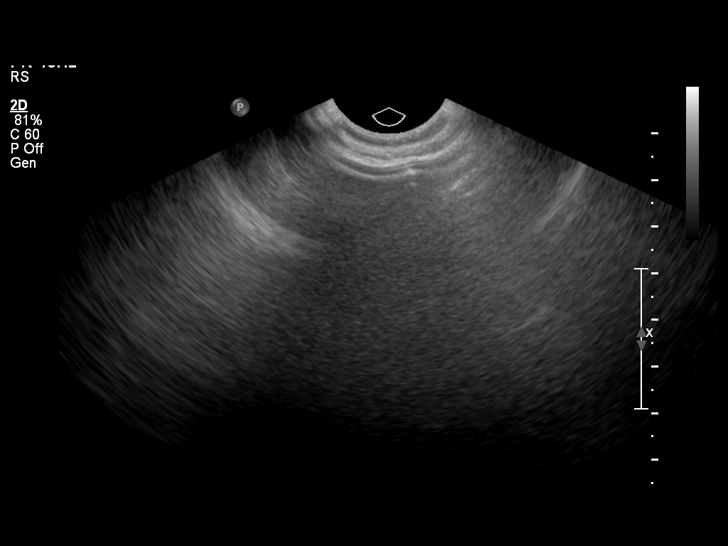
[im 17/42]
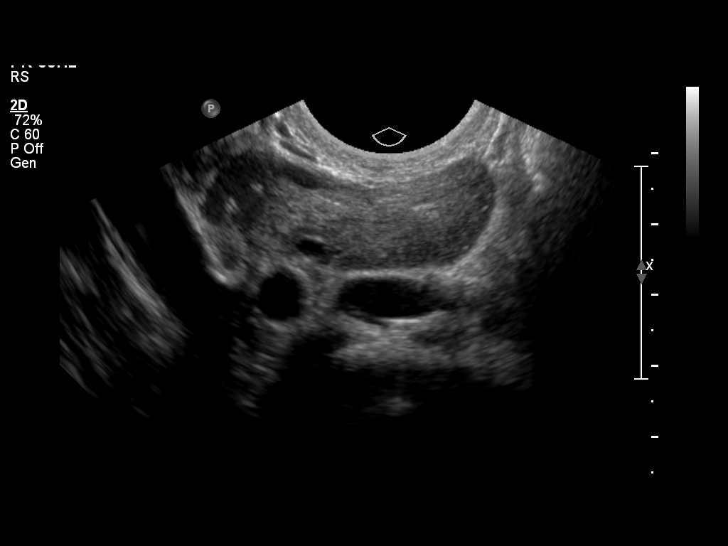
[im 20/42]
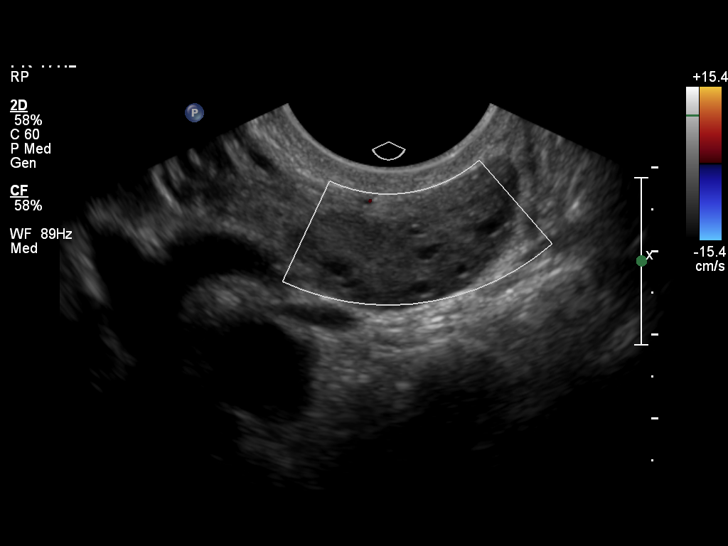
[im 23/42]
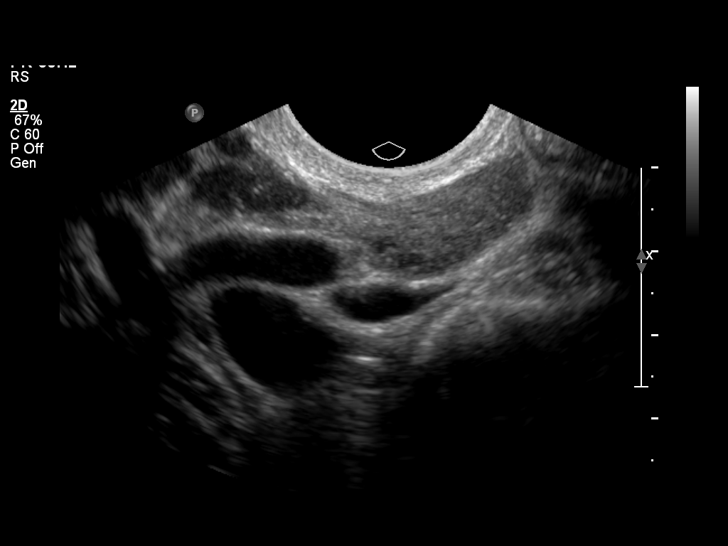
[im 26/42]
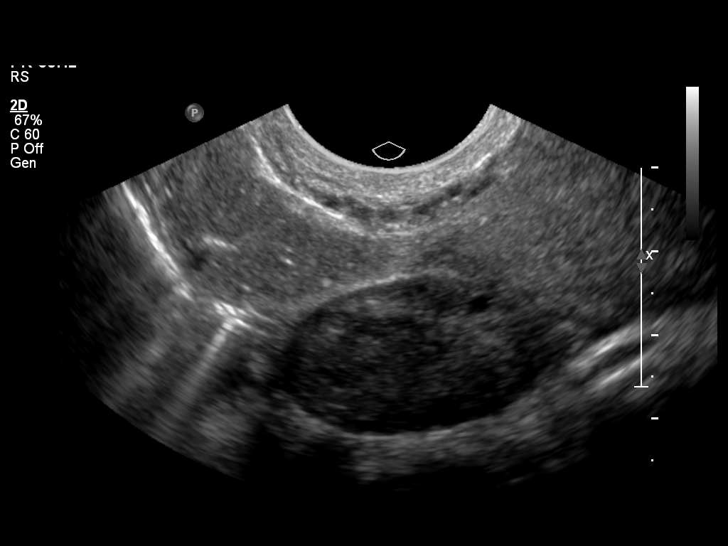
[im 29/42]
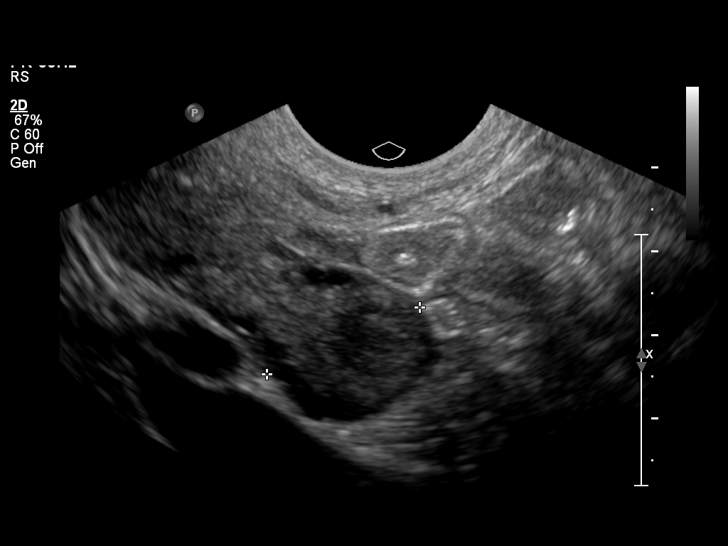
[im 32/42]
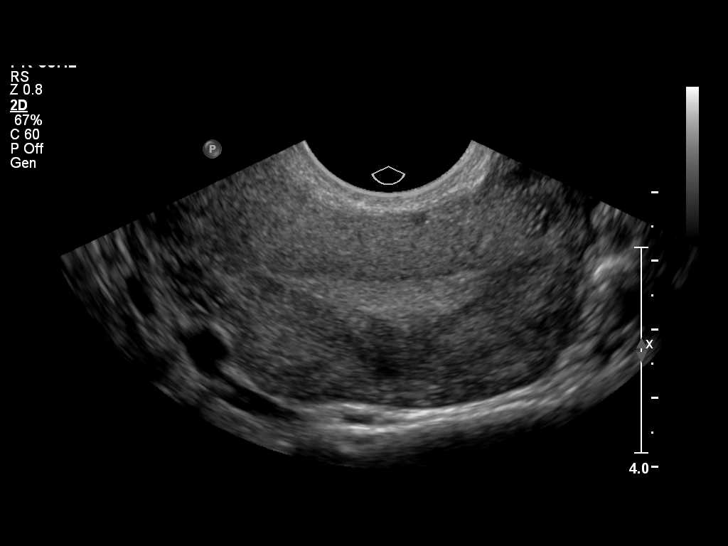
[im 35/42]
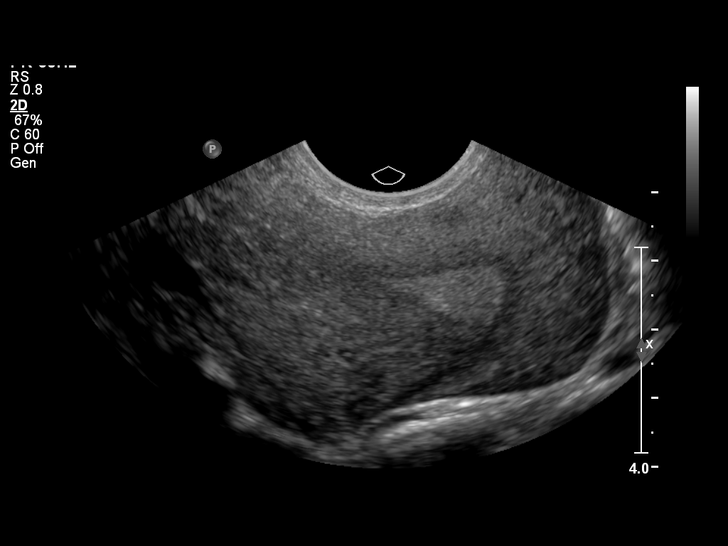
[im 38/42]
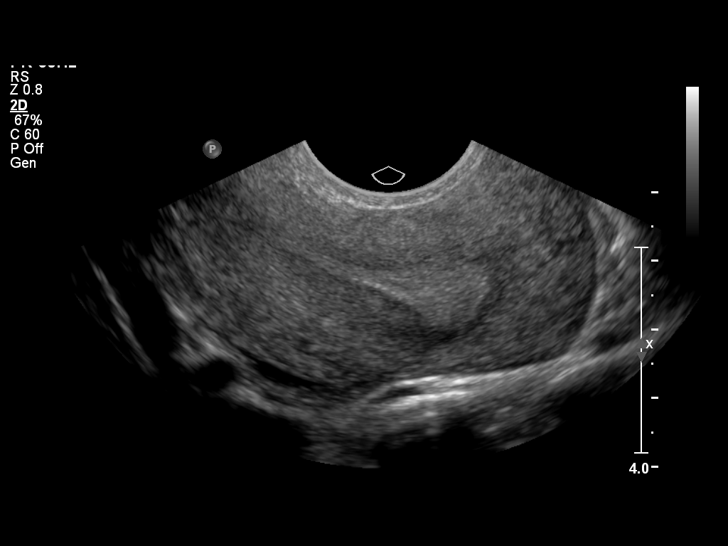
[im 42/42]
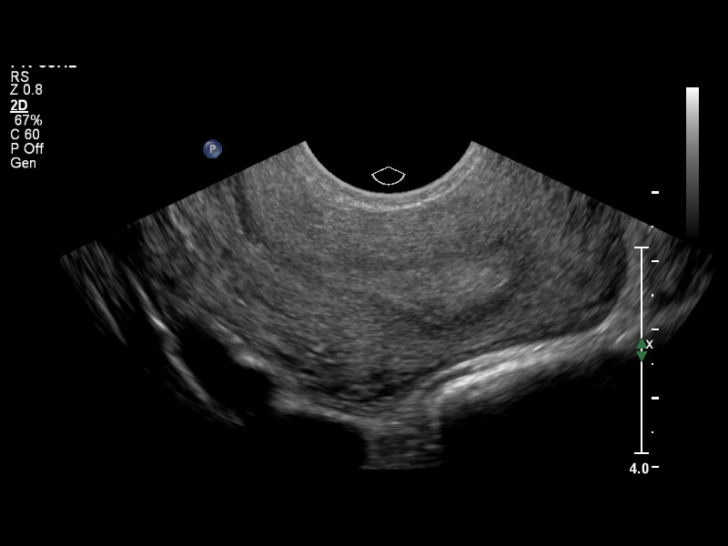

[14 of 28 positions shown; findings below may reference images not displayed]

FINDINGS: No intrauterine gestational sac is identified.
Endometrium measures up to 8 mm in thickness.  No definite double
decidual reaction is currently identified.

The left ovary measures 3.2 x 1.2 by 1.8 cm and the right ovary
measures 3.5 x 1.8 x 2.0 cm.  No definite abnormal adnexal mass.
No free pelvic fluid identified.
IMPRESSION: 1.  No intrauterine gestational sac is identified.  Endometrium
measures 8 mm in thickness.  Differential diagnostic
considerations include early pregnancy, occult ectopic pregnancy,
or spontaneous abortion.  Careful correlation with quantitative
beta HCG trend is recommended, along with a low threshold for
reimaging.
2.  The ovaries appear normal.

## 2012-02-03 ENCOUNTER — Ambulatory Visit: Payer: BC Managed Care – PPO | Admitting: Family Medicine

## 2012-02-03 VITALS — BP 108/65 | HR 101 | Temp 98.8°F | Resp 16 | Ht 62.0 in | Wt 129.0 lb

## 2012-02-03 DIAGNOSIS — IMO0001 Reserved for inherently not codable concepts without codable children: Secondary | ICD-10-CM

## 2012-02-03 DIAGNOSIS — J029 Acute pharyngitis, unspecified: Secondary | ICD-10-CM

## 2012-02-03 DIAGNOSIS — J020 Streptococcal pharyngitis: Secondary | ICD-10-CM

## 2012-02-03 DIAGNOSIS — B9789 Other viral agents as the cause of diseases classified elsewhere: Secondary | ICD-10-CM

## 2012-02-03 DIAGNOSIS — R509 Fever, unspecified: Secondary | ICD-10-CM

## 2012-02-03 DIAGNOSIS — R6889 Other general symptoms and signs: Secondary | ICD-10-CM

## 2012-02-03 LAB — POCT INFLUENZA A/B
Influenza A, POC: NEGATIVE
Influenza B, POC: NEGATIVE

## 2012-02-03 LAB — POCT RAPID STREP A (OFFICE): Rapid Strep A Screen: POSITIVE — AB

## 2012-02-03 MED ORDER — AMOXICILLIN 875 MG PO TABS: 875.0000 mg | ORAL_TABLET | Freq: Two times a day (BID) | ORAL | Status: AC

## 2012-02-03 NOTE — Progress Notes (Signed)
[facility]:  Office Visit  Chief Complaint:  Chief Complaint  Patient presents with  . Generalized Body Aches    4 days  patient is [redacted] weeks pregnant  . Sore Throat    HPI: [name] is a 26 y.o. female who complains of  Fever Tm 102, sorethroat, chills, msk aches x 4 days, tried cough medicine without relief, green sputum; took Tylenol cold without relief. Patient did not get flue vaccine although she is pregnant. She is [redacted] weeks pregnant. No sinus pressure.   Past Medical History  Diagnosis Date  . Heart murmur   . Urinary tract infection   . Chlamydia ?2007   Past Surgical History  Procedure Date  . Cesarean section   . Laparoscopy for ectopic pregnancy Feb 2012  . Salpingectomy    History   Social History  . Marital Status: Divorced    Spouse Name: N/A    Number of Children: N/A  . Years of Education: N/A   Social History Main Topics  . Smoking status: Former Games developer  . Smokeless tobacco: Never Used  . Alcohol Use: No  . Drug Use: No  . Sexually Active: Yes    Birth Control/ Protection: None   Other Topics Concern  . None   Social History Narrative  . None   Family History  Problem Relation Age of Onset  . Anesthesia problems Neg Hx    No Known Allergies Prior to Admission medications   Medication Sig Start Date End Date Taking? Authorizing Provider  acetaminophen (TYLENOL) 500 MG tablet Take 500 mg by mouth every 6 (six) hours as needed. For headache.   Yes Historical Provider, MD  butalbital-acetaminophen-caffeine (FIORICET WITH CODEINE) 50-325-40-30 MG per capsule Take 1 capsule by mouth every 4 (four) hours as needed. For headaches.   Yes Historical Provider, MD  Prenatal Vit-Fe Fumarate-FA (PRENATAL MULTIVITAMIN) TABS Take 1 tablet by mouth daily.   Yes Historical Provider, MD     ROS: The patient denies  night sweats, unintentional weight loss, chest pain, palpitations, wheezing, dyspnea on exertion, nausea, vomiting,  abdominal pain, dysuria, hematuria, melena, numbness, weakness, or tingling. + sore throat,fevers, chills,  All other systems have been reviewed and were otherwise negative with the exception of those mentioned in the HPI and as above.    PHYSICAL EXAM: Filed Vitals:   02/03/12 1825  BP: 108/65  Pulse: 101  Temp: 98.8 F (37.1 C)  Resp: 16   Filed Vitals:   02/03/12 1825  Height: 5\' 2"  (1.575 m)  Weight: 129 lb (58.514 kg)   Body mass index is 23.59 kg/(m^2).  General: Alert, no acute distress HEENT:  Normocephalic, atraumatic, oropharynx patent. TM nl, + erythematous tonsil +3, + exudate Cardiovascular:  Regular rate and rhythm, no rubs murmurs or gallops.  No Carotid bruits, radial pulse intact. No pedal edema.  Respiratory: Clear to auscultation bilaterally.  No wheezes, rales, or rhonchi.  No cyanosis, no use of accessory musculature GI: No organomegaly, abdomen is soft and non-tender, positive bowel sounds.  No masses. Skin: No rashes. Neurologic: Facial musculature symmetric. Psychiatric: Patient is appropriate throughout our interaction. Lymphatic: No cervical lymphadenopathy Musculoskeletal: Gait intact.   LABS: Results for orders placed in visit on 02/03/12  POCT INFLUENZA A/B      Component Value Range   Influenza A, POC Negative     Influenza B, POC Negative    POCT RAPID STREP A (OFFICE)      Component Value Range  Rapid Strep A Screen Positive (*) Negative      EKG/XRAY:   Primary read interpreted by Dr. Conley Rolls at [facility].   ASSESSMENT/PLAN: Encounter Diagnoses  Name Primary?  . Pharyngitis Yes  . Fever   . Flu-like symptoms    Strep + Rx Amoxacillin 875 mg BID, patient is pregnant. Sxs treatment for everything else    Rockne Coons, DO 02/03/2012 7:29 PM

## 2012-03-05 ENCOUNTER — Inpatient Hospital Stay (HOSPITAL_COMMUNITY)
Admission: AD | Admit: 2012-03-05 | Discharge: 2012-03-05 | Disposition: A | Discharge status: Home / Self Care | Payer: BC Managed Care – PPO | Source: Ambulatory Visit | Attending: Obstetrics & Gynecology | Admitting: Obstetrics & Gynecology

## 2012-03-05 ENCOUNTER — Encounter (HOSPITAL_COMMUNITY): Payer: Self-pay | Admitting: *Deleted

## 2012-03-05 DIAGNOSIS — N949 Unspecified condition associated with female genital organs and menstrual cycle: Secondary | ICD-10-CM | POA: Insufficient documentation

## 2012-03-05 DIAGNOSIS — O99891 Other specified diseases and conditions complicating pregnancy: Secondary | ICD-10-CM | POA: Insufficient documentation

## 2012-03-05 DIAGNOSIS — R109 Unspecified abdominal pain: Secondary | ICD-10-CM | POA: Insufficient documentation

## 2012-03-05 DIAGNOSIS — K5900 Constipation, unspecified: Secondary | ICD-10-CM | POA: Insufficient documentation

## 2012-03-05 LAB — WET PREP, GENITAL
Trich, Wet Prep: NONE SEEN
Yeast Wet Prep HPF POC: NONE SEEN

## 2012-03-05 LAB — URINALYSIS, ROUTINE W REFLEX MICROSCOPIC
Bilirubin Urine: NEGATIVE
Glucose, UA: NEGATIVE mg/dL
Hgb urine dipstick: NEGATIVE
Ketones, ur: NEGATIVE mg/dL
Leukocytes, UA: NEGATIVE
Protein, ur: NEGATIVE mg/dL
pH: 7 (ref 5.0–8.0)

## 2012-03-05 NOTE — MAU Provider Note (Signed)
History     CSN: [id number]  Arrival date and time: 03/05/12 1242   First Provider Initiated Contact with Patient 03/05/12 1353      Chief Complaint  Patient presents with  . Vaginal Discharge   HPI [name] 26 y.o. [redacted]w[redacted]d  Comes to MAU with watery discharge and cramping since 7 am today.  No vaginal bleeding.  No other problems.  OB History    Grav Para Term Preterm Abortions TAB SAB Ect Mult Living   3 1 1  0 1 0 0 1 1 2       Past Medical History  Diagnosis Date  . Heart murmur   . Urinary tract infection   . Chlamydia ?2007    Past Surgical History  Procedure Date  . Cesarean section   . Laparoscopy for ectopic pregnancy Feb 2012  . Salpingectomy     Family History  Problem Relation Age of Onset  . Anesthesia problems Neg Hx     History  Substance Use Topics  . Smoking status: Former Smoker    Quit date: 12/06/2011  . Smokeless tobacco: Never Used  . Alcohol Use: No    Allergies: No Known Allergies  Prescriptions prior to admission  Medication Sig Dispense Refill  . butalbital-acetaminophen-caffeine (FIORICET WITH CODEINE) 50-325-40-30 MG per capsule Take 1 capsule by mouth every 4 (four) hours as needed. For headaches.      . Prenatal Vit-Fe Fumarate-FA (PRENATAL MULTIVITAMIN) TABS Take 1 tablet by mouth at bedtime.       Marland Kitchen DISCONTD: acetaminophen (TYLENOL) 500 MG tablet Take 500 mg by mouth every 6 (six) hours as needed. For headache.        Review of Systems  Gastrointestinal: Positive for abdominal pain. Negative for nausea, vomiting and diarrhea.  Genitourinary: Negative for dysuria.       No vaginal bleeding Has watery discharge   Physical Exam   Blood pressure 111/47, pulse 88, temperature 98.1 F (36.7 C), resp. rate 16, height 5\' 2"  (1.575 m), weight 133 lb 6.4 oz (60.51 kg), last menstrual period 10/09/2011.  Physical Exam  Nursing note and vitals reviewed. Constitutional: She is oriented to person, place, and time. She appears  well-developed and well-nourished.  HENT:  Head: Normocephalic.  Eyes: EOM are normal.  Neck: Neck supple.  GI: Soft. There is no tenderness.       Fundus at umbilicus FHT heard with doppler   Genitourinary:       Speculum exam: Vagina - minimal discharge, no odor, no pooling Cervix - No contact bleeding, no leaking with valsalva Bimanual exam: Cervix closed Uterus non tender, gravid Adnexa non tender, no masses bilaterally Hard stool palpated in rectum on bimanual exam GC/Chlam, wet prep done Chaperone present for exam.  Musculoskeletal: Normal range of motion.  Neurological: She is alert and oriented to person, place, and time.  Skin: Skin is warm and dry.  Psychiatric: She has a normal mood and affect.    MAU Course  Procedures  MDM Results for orders placed during the hospital encounter of 03/05/12 (from the past 24 hour(s))  URINALYSIS, ROUTINE W REFLEX MICROSCOPIC     Status: Abnormal   Collection Time   03/05/12 12:50 PM      Component Value Range   Color, Urine YELLOW  YELLOW    APPearance HAZY (*) CLEAR    Specific Gravity, Urine 1.015  1.005 - 1.030    pH 7.0  5.0 - 8.0    Glucose, UA NEGATIVE  NEGATIVE (mg/dL)   Hgb urine dipstick NEGATIVE  NEGATIVE    Bilirubin Urine NEGATIVE  NEGATIVE    Ketones, ur NEGATIVE  NEGATIVE (mg/dL)   Protein, ur NEGATIVE  NEGATIVE (mg/dL)   Urobilinogen, UA 4.0 (*) 0.0 - 1.0 (mg/dL)   Nitrite NEGATIVE  NEGATIVE    Leukocytes, UA NEGATIVE  NEGATIVE   WET PREP, GENITAL     Status: Abnormal   Collection Time   03/05/12  2:03 PM      Component Value Range   Yeast Wet Prep HPF POC NONE SEEN  NONE SEEN    Trich, Wet Prep NONE SEEN  NONE SEEN    Clue Cells Wet Prep HPF POC FEW (*) NONE SEEN    WBC, Wet Prep HPF POC FEW (*) NONE SEEN      Assessment and Plan  Constipation in pregnancy  Plan Drink at least 8 8-oz glasses of water every day. Take Tylenol 325 mg 2 tablets by mouth every 4 hours if needed for pain. Keep your  appointment in the office.   Call your doctor if you have questions or concerns. Your urine and vaginal discharge are normal today.  Pasqual Farias 03/05/2012, 2:12 PM

## 2012-03-05 NOTE — Discharge Instructions (Signed)
Drink at least 8 8-oz glasses of water every day. Take Tylenol 325 mg 2 tablets by mouth every 4 hours if needed for pain. Keep your appointment in the office.   Call your doctor if you have questions or concerns. Your urine and vaginal discharge are normal today.

## 2012-03-05 NOTE — Progress Notes (Signed)
No pooling or discharge noted

## 2012-03-05 NOTE — MAU Note (Signed)
Onset of vaginal discharge since 0700 today has been constantly leaking, mild cramping, G3P1

## 2012-04-11 ENCOUNTER — Encounter (HOSPITAL_BASED_OUTPATIENT_CLINIC_OR_DEPARTMENT_OTHER): Payer: Self-pay

## 2012-04-11 ENCOUNTER — Emergency Department (HOSPITAL_BASED_OUTPATIENT_CLINIC_OR_DEPARTMENT_OTHER)
Admission: EM | Admit: 2012-04-11 | Discharge: 2012-04-11 | Disposition: A | Discharge status: Home / Self Care | Payer: BC Managed Care – PPO | Attending: Emergency Medicine | Admitting: Emergency Medicine

## 2012-04-11 DIAGNOSIS — R05 Cough: Secondary | ICD-10-CM | POA: Insufficient documentation

## 2012-04-11 DIAGNOSIS — R059 Cough, unspecified: Secondary | ICD-10-CM | POA: Insufficient documentation

## 2012-04-11 DIAGNOSIS — O99891 Other specified diseases and conditions complicating pregnancy: Secondary | ICD-10-CM | POA: Insufficient documentation

## 2012-04-11 DIAGNOSIS — J069 Acute upper respiratory infection, unspecified: Secondary | ICD-10-CM | POA: Insufficient documentation

## 2012-04-11 DIAGNOSIS — R0789 Other chest pain: Secondary | ICD-10-CM | POA: Insufficient documentation

## 2012-04-11 LAB — RAPID STREP SCREEN (MED CTR MEBANE ONLY): Streptococcus, Group A Screen (Direct): NEGATIVE

## 2012-04-11 MED ORDER — ALBUTEROL SULFATE HFA 108 (90 BASE) MCG/ACT IN AERS
2.0000 | INHALATION_SPRAY | Freq: Once | RESPIRATORY_TRACT | Status: AC
  Administered 2012-04-11: 2 via RESPIRATORY_TRACT
  Filled 2012-04-11: qty 6.7

## 2012-04-11 MED ORDER — LIDOCAINE VISCOUS 2 % MT SOLN: OROMUCOSAL | Status: DC

## 2012-04-11 NOTE — Discharge Instructions (Signed)
Albuterol inhaler for cough: 1-2 puffs every 4-6 hours for cough.    Antibiotic Nonuse  Your caregiver felt that the infection or problem was not one that would be helped with an antibiotic. Infections may be caused by viruses or bacteria. Only a caregiver can tell which one of these is the likely cause of an illness. A cold is the most common cause of infection in both adults and children. A cold is a virus. Antibiotic treatment will have no effect on a viral infection. Viruses can lead to many lost days of work caring for sick children and many missed days of school. Children may catch as many as 10 "colds" or "flus" per year during which they can be tearful, cranky, and uncomfortable. The goal of treating a virus is aimed at keeping the ill person comfortable. Antibiotics are medications used to help the body fight bacterial infections. There are relatively few types of bacteria that cause infections but there are hundreds of viruses. While both viruses and bacteria cause infection they are very different types of germs. A viral infection will typically go away by itself within 7 to 10 days. Bacterial infections may spread or get worse without antibiotic treatment. Examples of bacterial infections are:  Sore throats (like strep throat or tonsillitis).   Infection in the lung (pneumonia).   Ear and skin infections.  Examples of viral infections are:  Colds or flus.   Most coughs and bronchitis.   Sore throats not caused by Strep.   Runny noses.  It is often best not to take an antibiotic when a viral infection is the cause of the problem. Antibiotics can kill off the helpful bacteria that we have inside our body and allow harmful bacteria to start growing. Antibiotics can cause side effects such as allergies, nausea, and diarrhea without helping to improve the symptoms of the viral infection. Additionally, repeated uses of antibiotics can cause bacteria inside of our body to become  resistant. That resistance can be passed onto harmful bacterial. The next time you have an infection it may be harder to treat if antibiotics are used when they are not needed. Not treating with antibiotics allows our own immune system to develop and take care of infections more efficiently. Also, antibiotics will work better for [place] when they are prescribed for bacterial infections. Treatments for a child that is ill may include:  Give extra fluids throughout the day to stay hydrated.   Get plenty of rest.   Only give your child over-the-counter or prescription medicines for pain, discomfort, or fever as directed by your caregiver.   The use of a cool mist humidifier may help stuffy noses.   Cold medications if suggested by your caregiver.  Your caregiver may decide to start you on an antibiotic if:  The problem you were seen for today continues for a longer length of time than expected.   You develop a secondary bacterial infection.  SEEK MEDICAL CARE IF:  Fever lasts longer than 5 days.   Symptoms continue to get worse after 5 to 7 days or become severe.   Difficulty in breathing develops.   Signs of dehydration develop (poor drinking, rare urinating, dark colored urine).   Changes in behavior or worsening tiredness (listlessness or lethargy).  Document Released: 01/18/2002 Document Revised: 10/29/2011 Document Reviewed: 07/17/2009 [facility] Patient Information 2012 [place], [place].Upper Respiratory Infection, Adult An upper respiratory infection (URI) is also sometimes known as the common cold. The upper respiratory tract includes the nose,  sinuses, throat, trachea, and bronchi. Bronchi are the airways leading to the lungs. Most people improve within 1 week, but symptoms can last up to 2 weeks. A residual cough may last even longer.  CAUSES Many different viruses can infect the tissues lining the upper respiratory tract. The tissues become irritated and inflamed and often become  very moist. Mucus production is also common. A cold is contagious. You can easily spread the virus to others by oral contact. This includes kissing, sharing a glass, coughing, or sneezing. Touching your mouth or nose and then touching a surface, which is then touched by another person, can also spread the virus. SYMPTOMS  Symptoms typically develop 1 to 3 days after you come in contact with a cold virus. Symptoms vary from person to person. They may include:  Runny nose.   Sneezing.   Nasal congestion.   Sinus irritation.   Sore throat.   Loss of voice (laryngitis).   Cough.   Fatigue.   Muscle aches.   Loss of appetite.   Headache.   Low-grade fever.  DIAGNOSIS  You might diagnose your own cold based on familiar symptoms, since most people get a cold 2 to 3 times a year. Your caregiver can confirm this based on your exam. Most importantly, your caregiver can check that your symptoms are not due to another disease such as strep throat, sinusitis, pneumonia, asthma, or epiglottitis. Blood tests, throat tests, and X-rays are not necessary to diagnose a common cold, but they may sometimes be helpful in excluding other more serious diseases. Your caregiver will decide if any further tests are required. RISKS AND COMPLICATIONS  You may be at risk for a more severe case of the common cold if you smoke cigarettes, have chronic heart disease (such as heart failure) or lung disease (such as asthma), or if you have a weakened immune system. The very young and very old are also at risk for more serious infections. Bacterial sinusitis, middle ear infections, and bacterial pneumonia can complicate the common cold. The common cold can worsen asthma and chronic obstructive pulmonary disease (COPD). Sometimes, these complications can require emergency medical care and may be life-threatening. PREVENTION  The best way to protect against getting a cold is to practice good hygiene. Avoid oral or hand  contact with people with cold symptoms. Wash your hands often if contact occurs. There is no clear evidence that vitamin C, vitamin E, echinacea, or exercise reduces the chance of developing a cold. However, it is always recommended to get plenty of rest and practice good nutrition. TREATMENT  Treatment is directed at relieving symptoms. There is no cure. Antibiotics are not effective, because the infection is caused by a virus, not by bacteria. Treatment may include:  Increased fluid intake. Sports drinks offer valuable electrolytes, sugars, and fluids.   Breathing heated mist or steam (vaporizer or shower).   Eating chicken soup or other clear broths, and maintaining good nutrition.   Getting plenty of rest.   Using gargles or lozenges for comfort.   Controlling fevers with ibuprofen or acetaminophen as directed by your caregiver.   Increasing usage of your inhaler if you have asthma.  Zinc gel and zinc lozenges, taken in the first 24 hours of the common cold, can shorten the duration and lessen the severity of symptoms. Pain medicines may help with fever, muscle aches, and throat pain. A variety of non-prescription medicines are available to treat congestion and runny nose. Your caregiver can make recommendations and  may suggest nasal or lung inhalers for other symptoms.  HOME CARE INSTRUCTIONS   Only take over-the-counter or prescription medicines for pain, discomfort, or fever as directed by your caregiver.   Use a warm mist humidifier or inhale steam from a shower to increase air moisture. This may keep secretions moist and make it easier to breathe.   Drink enough water and fluids to keep your urine clear or pale yellow.   Rest as needed.   Return to work when your temperature has returned to normal or as your caregiver advises. You may need to stay home longer to avoid infecting others. You can also use a face mask and careful hand washing to prevent spread of the virus.  SEEK  MEDICAL CARE IF:   After the first few days, you feel you are getting worse rather than better.   You need your caregiver's advice about medicines to control symptoms.   You develop chills, worsening shortness of breath, or brown or red sputum. These may be signs of pneumonia.   You develop yellow or brown nasal discharge or pain in the face, especially when you bend forward. These may be signs of sinusitis.   You develop a fever, swollen neck glands, pain with swallowing, or white areas in the back of your throat. These may be signs of strep throat.  SEEK IMMEDIATE MEDICAL CARE IF:   You have a fever.   You develop severe or persistent headache, ear pain, sinus pain, or chest pain.   You develop wheezing, a prolonged cough, cough up blood, or have a change in your usual mucus (if you have chronic lung disease).   You develop sore muscles or a stiff neck.  Document Released: 05/05/2001 Document Revised: 10/29/2011 Document Reviewed: 03/13/2011 [facility] Patient Information 2012 [place], [place].Upper Respiratory Infection, Adult An upper respiratory infection (URI) is also sometimes known as the common cold. The upper respiratory tract includes the nose, sinuses, throat, trachea, and bronchi. Bronchi are the airways leading to the lungs. Most people improve within 1 week, but symptoms can last up to 2 weeks. A residual cough may last even longer.  CAUSES Many different viruses can infect the tissues lining the upper respiratory tract. The tissues become irritated and inflamed and often become very moist. Mucus production is also common. A cold is contagious. You can easily spread the virus to others by oral contact. This includes kissing, sharing a glass, coughing, or sneezing. Touching your mouth or nose and then touching a surface, which is then touched by another person, can also spread the virus. SYMPTOMS  Symptoms typically develop 1 to 3 days after you come in contact with a cold virus.  Symptoms vary from person to person. They may include:  Runny nose.   Sneezing.   Nasal congestion.   Sinus irritation.   Sore throat.   Loss of voice (laryngitis).   Cough.   Fatigue.   Muscle aches.   Loss of appetite.   Headache.   Low-grade fever.  DIAGNOSIS  You might diagnose your own cold based on familiar symptoms, since most people get a cold 2 to 3 times a year. Your caregiver can confirm this based on your exam. Most importantly, your caregiver can check that your symptoms are not due to another disease such as strep throat, sinusitis, pneumonia, asthma, or epiglottitis. Blood tests, throat tests, and X-rays are not necessary to diagnose a common cold, but they may sometimes be helpful in excluding other more serious diseases.  Your caregiver will decide if any further tests are required. RISKS AND COMPLICATIONS  You may be at risk for a more severe case of the common cold if you smoke cigarettes, have chronic heart disease (such as heart failure) or lung disease (such as asthma), or if you have a weakened immune system. The very young and very old are also at risk for more serious infections. Bacterial sinusitis, middle ear infections, and bacterial pneumonia can complicate the common cold. The common cold can worsen asthma and chronic obstructive pulmonary disease (COPD). Sometimes, these complications can require emergency medical care and may be life-threatening. PREVENTION  The best way to protect against getting a cold is to practice good hygiene. Avoid oral or hand contact with people with cold symptoms. Wash your hands often if contact occurs. There is no clear evidence that vitamin C, vitamin E, echinacea, or exercise reduces the chance of developing a cold. However, it is always recommended to get plenty of rest and practice good nutrition. TREATMENT  Treatment is directed at relieving symptoms. There is no cure. Antibiotics are not effective, because the infection  is caused by a virus, not by bacteria. Treatment may include:  Increased fluid intake. Sports drinks offer valuable electrolytes, sugars, and fluids.   Breathing heated mist or steam (vaporizer or shower).   Eating chicken soup or other clear broths, and maintaining good nutrition.   Getting plenty of rest.   Using gargles or lozenges for comfort.   Controlling fevers with ibuprofen or acetaminophen as directed by your caregiver.   Increasing usage of your inhaler if you have asthma.  Zinc gel and zinc lozenges, taken in the first 24 hours of the common cold, can shorten the duration and lessen the severity of symptoms. Pain medicines may help with fever, muscle aches, and throat pain. A variety of non-prescription medicines are available to treat congestion and runny nose. Your caregiver can make recommendations and may suggest nasal or lung inhalers for other symptoms.  HOME CARE INSTRUCTIONS   Only take over-the-counter or prescription medicines for pain, discomfort, or fever as directed by your caregiver.   Use a warm mist humidifier or inhale steam from a shower to increase air moisture. This may keep secretions moist and make it easier to breathe.   Drink enough water and fluids to keep your urine clear or pale yellow.   Rest as needed.   Return to work when your temperature has returned to normal or as your caregiver advises. You may need to stay home longer to avoid infecting others. You can also use a face mask and careful hand washing to prevent spread of the virus.  SEEK MEDICAL CARE IF:   After the first few days, you feel you are getting worse rather than better.   You need your caregiver's advice about medicines to control symptoms.   You develop chills, worsening shortness of breath, or brown or red sputum. These may be signs of pneumonia.   You develop yellow or brown nasal discharge or pain in the face, especially when you bend forward. These may be signs of  sinusitis.   You develop a fever, swollen neck glands, pain with swallowing, or white areas in the back of your throat. These may be signs of strep throat.  SEEK IMMEDIATE MEDICAL CARE IF:   You have a fever.   You develop severe or persistent headache, ear pain, sinus pain, or chest pain.   You develop wheezing, a prolonged cough, cough up  blood, or have a change in your usual mucus (if you have chronic lung disease).   You develop sore muscles or a stiff neck.  Document Released: 05/05/2001 Document Revised: 10/29/2011 Document Reviewed: 03/13/2011 [facility] Patient Information 2012 [place], [place].

## 2012-04-11 NOTE — ED Notes (Signed)
Pt reports sore throat, cough and chest tightness x 3 days unrelieved after taking Tylenol.

## 2012-04-11 NOTE — ED Provider Notes (Signed)
History     CSN: [id number]  Arrival date & time 04/11/12  0917   First MD Initiated Contact with Patient 04/11/12 1211      12:15 PM HPI Pt reports she is here for a cough and sore throat for 3 days. Cough is productive with grey sputum. Reports cough began before the sore throat. A/w of chest tightness that is worse with coughing. Sore throat is sharp and worse with swallowing. Also c/o nasal congestion. Denies SOB, fevers, headaches. Denies h/o allergies, or headaches. Denies known sick contacts. Patient has a significant h/o being [redacted] weeks pregnant.    Patient is a 26 y.o. female presenting with URI. The history is provided by the patient.  URI The primary symptoms include sore throat, cough and wheezing. Primary symptoms do not include fever, headaches, ear pain, abdominal pain, nausea, vomiting, myalgias or rash. The current episode started 3 to 5 days ago. This is a new problem. The problem has not changed since onset. Symptoms associated with the illness include congestion. The illness is not associated with chills, facial pain, sinus pressure or rhinorrhea. The following treatments were addressed: Acetaminophen was ineffective.    Past Medical History  Diagnosis Date  . Heart murmur   . Urinary tract infection   . Chlamydia ?2007    Past Surgical History  Procedure Date  . Cesarean section   . Laparoscopy for ectopic pregnancy Feb 2012  . Salpingectomy     Family History  Problem Relation Age of Onset  . Anesthesia problems Neg Hx     History  Substance Use Topics  . Smoking status: Former Smoker    Quit date: 12/06/2011  . Smokeless tobacco: Never Used  . Alcohol Use: No    OB History    Grav Para Term Preterm Abortions TAB SAB Ect Mult Living   3 1 1  0 1 0 0 1 1 2       Review of Systems  Constitutional: Negative for fever and chills.  HENT: Positive for congestion, sore throat and voice change. Negative for ear pain, rhinorrhea, neck pain, neck  stiffness, postnasal drip and sinus pressure.   Respiratory: Positive for cough and wheezing. Negative for shortness of breath.   Cardiovascular: Positive for chest pain (chest tightness).  Gastrointestinal: Negative for nausea, vomiting and abdominal pain.  Genitourinary: Negative for dysuria, urgency and hematuria.  Musculoskeletal: Negative for myalgias and back pain.  Skin: Negative for rash.  Neurological: Negative for dizziness, light-headedness and headaches.  All other systems reviewed and are negative.    Allergies  Review of patient's allergies indicates no known allergies.  Home Medications   Current Outpatient Rx  Name Route Sig Dispense Refill  . BUTALBITAL-APAP-CAFF-COD 50-325-40-30 MG PO CAPS Oral Take 1 capsule by mouth every 4 (four) hours as needed. For headaches.    Marland Kitchen PRENATAL MULTIVITAMIN CH Oral Take 1 tablet by mouth at bedtime.       BP 111/51  Pulse 92  Temp(Src) 98.5 F (36.9 C) (Oral)  Ht 5\' 3"  (1.6 m)  Wt 140 lb (63.504 kg)  BMI 24.80 kg/m2  SpO2 100%  LMP 10/09/2011  Physical Exam  Vitals reviewed. Constitutional: She is oriented to person, place, and time. Vital signs are normal. She appears well-developed and well-nourished.  HENT:  Head: Normocephalic and atraumatic.  Right Ear: External ear normal.  Left Ear: External ear normal.  Nose: Nose normal.  Mouth/Throat: Oropharynx is clear and moist. No oropharyngeal exudate.  Eyes: Conjunctivae are normal.  Pupils are equal, round, and reactive to light. Right eye exhibits no discharge. Left eye exhibits no discharge.  Neck: Trachea normal, normal range of motion and full passive range of motion without pain. Neck supple. No spinous process tenderness and no muscular tenderness present. No rigidity.  Cardiovascular: Normal rate, regular rhythm and normal heart sounds.  Exam reveals no gallop and no friction rub.   No murmur heard. Pulmonary/Chest: Effort normal and breath sounds normal. She has  no wheezes. She has no rhonchi. She has no rales. She exhibits no tenderness.  Abdominal: Soft. Bowel sounds are normal.       Gravid  Musculoskeletal: Normal range of motion.  Lymphadenopathy:    She has no cervical adenopathy.  Neurological: She is alert and oriented to person, place, and time.  Skin: Skin is warm and dry. No rash noted. No erythema. No pallor.    ED Course  Procedures   MDM   Patient has signs and symptoms of upper respiratory tract infection. Will provide patient with pregnancy take medications for symptom relief. Patient is unsure of breath and has not had tachycardia. Low  suspicion for PE. Symptoms are consistent with URI. Advised patient should she develop a fever, shortness of breath, neck pain headaches to return to the ED. Patient voices understanding and is ready for discharge      Thomasene Lot, Cordelia Poche 04/11/12 1251

## 2012-04-13 ENCOUNTER — Other Ambulatory Visit: Payer: Self-pay | Admitting: Obstetrics & Gynecology

## 2012-04-18 ENCOUNTER — Inpatient Hospital Stay (HOSPITAL_COMMUNITY)
Admission: AD | Admit: 2012-04-18 | Discharge: 2012-04-18 | Disposition: A | Discharge status: Home / Self Care | Payer: BC Managed Care – PPO | Source: Ambulatory Visit | Attending: Obstetrics & Gynecology | Admitting: Obstetrics & Gynecology

## 2012-04-18 DIAGNOSIS — Z2989 Encounter for other specified prophylactic measures: Secondary | ICD-10-CM | POA: Insufficient documentation

## 2012-04-18 DIAGNOSIS — Z298 Encounter for other specified prophylactic measures: Secondary | ICD-10-CM | POA: Insufficient documentation

## 2012-04-18 MED ORDER — RHO D IMMUNE GLOBULIN 1500 UNIT/2ML IJ SOLN
300.0000 ug | Freq: Once | INTRAMUSCULAR | Status: DC
  Administered 2012-04-18: 300 ug via INTRAMUSCULAR
  Filled 2012-04-18: qty 2

## 2012-04-18 NOTE — MAU Note (Signed)
Has received previously without incident.  Information/handout given to pt.  Time frame associated with draw and injection explained.

## 2012-04-18 NOTE — MAU Note (Signed)
Pt asking if her daughter could also have this blood type.  Explained further testing and possible need for further doses.

## 2012-04-19 LAB — RH IG WORKUP (INCLUDES ABO/RH): Gestational Age(Wks): 27

## 2012-04-21 ENCOUNTER — Inpatient Hospital Stay (HOSPITAL_COMMUNITY)
Admission: AD | Admit: 2012-04-21 | Discharge: 2012-04-21 | Disposition: A | Discharge status: Home / Self Care | Payer: BC Managed Care – PPO | Source: Ambulatory Visit | Attending: Obstetrics & Gynecology | Admitting: Obstetrics & Gynecology

## 2012-04-21 ENCOUNTER — Encounter (HOSPITAL_COMMUNITY): Payer: Self-pay

## 2012-04-21 DIAGNOSIS — B9689 Other specified bacterial agents as the cause of diseases classified elsewhere: Secondary | ICD-10-CM | POA: Insufficient documentation

## 2012-04-21 DIAGNOSIS — O368190 Decreased fetal movements, unspecified trimester, not applicable or unspecified: Secondary | ICD-10-CM | POA: Insufficient documentation

## 2012-04-21 DIAGNOSIS — O2390 Unspecified genitourinary tract infection in pregnancy, unspecified trimester: Secondary | ICD-10-CM | POA: Insufficient documentation

## 2012-04-21 DIAGNOSIS — N760 Acute vaginitis: Secondary | ICD-10-CM | POA: Insufficient documentation

## 2012-04-21 DIAGNOSIS — A499 Bacterial infection, unspecified: Secondary | ICD-10-CM | POA: Insufficient documentation

## 2012-04-21 DIAGNOSIS — M549 Dorsalgia, unspecified: Secondary | ICD-10-CM | POA: Insufficient documentation

## 2012-04-21 DIAGNOSIS — O4700 False labor before 37 completed weeks of gestation, unspecified trimester: Secondary | ICD-10-CM | POA: Insufficient documentation

## 2012-04-21 LAB — URINALYSIS, ROUTINE W REFLEX MICROSCOPIC
Bilirubin Urine: NEGATIVE
Ketones, ur: NEGATIVE mg/dL
Leukocytes, UA: NEGATIVE
Nitrite: NEGATIVE
Specific Gravity, Urine: 1.02 (ref 1.005–1.030)
Urobilinogen, UA: 1 mg/dL (ref 0.0–1.0)

## 2012-04-21 LAB — WET PREP, GENITAL
Trich, Wet Prep: NONE SEEN
Yeast Wet Prep HPF POC: NONE SEEN

## 2012-04-21 LAB — FETAL FIBRONECTIN: Fetal Fibronectin: NEGATIVE

## 2012-04-21 MED ORDER — METRONIDAZOLE 500 MG PO TABS: 500.0000 mg | ORAL_TABLET | Freq: Two times a day (BID) | ORAL | Status: AC

## 2012-04-21 MED ORDER — NIFEDIPINE 10 MG PO CAPS
20.0000 mg | ORAL_CAPSULE | ORAL | Status: AC
  Administered 2012-04-21: 20 mg via ORAL
  Filled 2012-04-21: qty 2

## 2012-04-21 NOTE — MAU Note (Signed)
Pt states lower abdominal and low back pain began yesterday, denies abnormal vaginal d/c changes or bleeding. Decreased fm per pt.

## 2012-04-21 NOTE — MAU Note (Signed)
Patient states she started having cramping yesterday and noticed the baby not moving as much as usual. States pain now feels like contractions in the back and baby still not as active. Denies any bleeding or leaking. Good fetal heart tones in triage and audible fetal movement.

## 2012-04-21 NOTE — MAU Note (Signed)
No adverse effect from procardia

## 2012-04-21 NOTE — ED Provider Notes (Signed)
Medical screening examination/treatment/procedure(s) were performed by non-physician practitioner and as supervising physician I was immediately available for consultation/collaboration.   Baillie Mohammad M Lyrique Hakim, MD 04/21/12 0111 

## 2012-04-21 NOTE — Discharge Instructions (Signed)
Bacterial Vaginosis Bacterial vaginosis (BV) is a vaginal infection where the normal balance of bacteria in the vagina is disrupted. The normal balance is then replaced by an overgrowth of certain bacteria. There are several different kinds of bacteria that can cause BV. BV is the most common vaginal infection in women of childbearing age. CAUSES   The cause of BV is not fully understood. BV develops when there is an increase or imbalance of harmful bacteria.   Some activities or behaviors can upset the normal balance of bacteria in the vagina and put women at increased risk including:   Having a new sex partner or multiple sex partners.   Douching.   Using an intrauterine device (IUD) for contraception.   It is not clear what role sexual activity plays in the development of BV. However, women that have never had sexual intercourse are rarely infected with BV.  Women do not get BV from toilet seats, bedding, swimming pools or from touching objects around them.  SYMPTOMS   Grey vaginal discharge.   A fish-like odor with discharge, especially after sexual intercourse.   Itching or burning of the vagina and vulva.   Burning or pain with urination.   Some women have no signs or symptoms at all.  DIAGNOSIS  Your caregiver must examine the vagina for signs of BV. Your caregiver will perform lab tests and look at the sample of vaginal fluid through a microscope. They will look for bacteria and abnormal cells (clue cells), a pH test higher than 4.5, and a positive amine test all associated with BV.  RISKS AND COMPLICATIONS   Pelvic inflammatory disease (PID).   Infections following gynecology surgery.   Developing HIV.   Developing herpes virus.  TREATMENT  Sometimes BV will clear up without treatment. However, all women with symptoms of BV should be treated to avoid complications, especially if gynecology surgery is planned. Female partners generally do not need to be treated. However,  BV may spread between female sex partners so treatment is helpful in preventing a recurrence of BV.   BV may be treated with antibiotics. The antibiotics come in either pill or vaginal cream forms. Either can be used with nonpregnant or pregnant women, but the recommended dosages differ. These antibiotics are not harmful to the baby.   BV can recur after treatment. If this happens, a second round of antibiotics will often be prescribed.   Treatment is important for pregnant women. If not treated, BV can cause a premature delivery, especially for a pregnant woman who had a premature birth in the past. All pregnant women who have symptoms of BV should be checked and treated.   For chronic reoccurrence of BV, treatment with a type of prescribed gel vaginally twice a week is helpful.  HOME CARE INSTRUCTIONS   Finish all medication as directed by your caregiver.   Do not have sex until treatment is completed.   Tell your sexual partner that you have a vaginal infection. They should see their caregiver and be treated if they have problems, such as a mild rash or itching.   Practice safe sex. Use condoms. Only have 1 sex partner.  PREVENTION  Basic prevention steps can help reduce the risk of upsetting the natural balance of bacteria in the vagina and developing BV:  Do not have sexual intercourse (be abstinent).   Do not douche.   Use all of the medicine prescribed for treatment of BV, even if the signs and symptoms go away.     Tell your sex partner if you have BV. That way, they can be treated, if needed, to prevent reoccurrence.  SEEK MEDICAL CARE IF:   Your symptoms are not improving after 3 days of treatment.   You have increased discharge, pain, or fever.  MAKE SURE YOU:   Understand these instructions.   Will watch your condition.   Will get help right away if you are not doing well or get worse.  FOR MORE INFORMATION  Division of STD Prevention (DSTDP), Centers for Disease  Control and Prevention: [employer]Apps.[employer]za American Social Health Association (ASHA): www.ashastd.org  Document Released: 11/09/2005 Document Revised: 10/29/2011 Document Reviewed: 05/02/2009 [facility] Patient Information 2012 [place], [place].  Preterm Labor Preterm labor is when labor starts at less than 37 weeks of pregnancy. The normal length of a pregnancy is 39 to 41 weeks. CAUSES Often, there is no identifiable underlying cause as to why a woman goes into preterm labor. However, one of the most common known causes of preterm labor is infection. Infections of the uterus, cervix, vagina, amniotic sac, bladder, kidney, or even the lungs (pneumonia) can cause labor to start. Other causes of preterm labor include:  Urogenital infections, such as yeast infections and bacterial vaginosis.   Uterine abnormalities (uterine shape, uterine septum, fibroids, bleeding from the placenta).   A cervix that has been operated on and opens prematurely.   Malformations in the baby.   Multiple gestations (twins, triplets, and so on).   Breakage of the amniotic sac.  Additional risk factors for preterm labor include:  Previous history of preterm labor.   Premature rupture of membranes (PROM).   A placenta that covers the opening of the cervix (placenta previa).   A placenta that separates from the uterus (placenta abruption).   A cervix that is too weak to hold the baby in the uterus (incompetence cervix).   Having too much fluid in the amniotic sac (polyhydramnios).   Taking illegal drugs or smoking while pregnant.   Not gaining enough weight while pregnant.   Women younger than 15 and older than 25 years old.   Low socioeconomic status.   African-American ethnicity.  SYMPTOMS Signs and symptoms of preterm labor include:  Menstrual-like cramps.   Contractions that are 30 to 70 seconds apart, become very regular, closer together, and are more intense and painful.   Contractions that  start on the top of the uterus and spread down to the lower abdomen and back.   A sense of increased pelvic pressure or back pain.   A watery or bloody discharge that comes from the vagina.  DIAGNOSIS  A diagnosis can be confirmed by:  A vaginal exam.   An ultrasound of the cervix.   Sampling (swabbing) cervico-vaginal secretions. These samples can be tested for the presence of fetal fibronectin. This is a protein found in cervical discharge which is associated with preterm labor.   Fetal monitoring.  TREATMENT  Depending on the length of the pregnancy and other circumstances, a caregiver may suggest bed rest. If necessary, there are medicines that can be given to stop contractions and to quicken fetal lung maturity. If labor happens before 34 weeks of pregnancy, a prolonged hospital stay may be recommended. Treatment depends on the condition of both the mother and baby. PREVENTION There are some things a mother can do to lower the risk of preterm labor in future pregnancies. A woman can:   Stop smoking.   Maintain healthy weight gain and avoid chemicals and drugs that  are not necessary.   Be watchful for any type of infection.   Inform her caregiver if she has a known history of preterm labor.  Document Released: 01/30/2004 Document Revised: 10/29/2011 Document Reviewed: 03/06/2011 [facility] Patient Information 2012 [place], [place].

## 2012-04-21 NOTE — MAU Provider Note (Signed)
Chief Complaint:  Decreased Fetal Movement and Back Pain   First Provider Initiated Contact with Patient 04/21/12 1440      HPI  [name] is a 26 y.o. G3P1012 at [redacted]w[redacted]d presenting with back pain and decreased fetal movement.  She has felt lots of fetal movement since arrival to MAU.  She denies LOF, vaginal bleeding, vaginal itching/burning, urinary symptoms, h/a, dizziness, n/v, or fever/chills.     Pregnancy Course: uncomplicated  Past Medical History: Past Medical History  Diagnosis Date  . Heart murmur   . Urinary tract infection   . Chlamydia ?2007    Past Surgical History: Past Surgical History  Procedure Date  . Cesarean section   . Laparoscopy for ectopic pregnancy Feb 2012  . Salpingectomy     Family History: Family History  Problem Relation Age of Onset  . Anesthesia problems Neg Hx   . Hypotension Neg Hx   . Malignant hyperthermia Neg Hx   . Pseudochol deficiency Neg Hx     Social History: History  Substance Use Topics  . Smoking status: Former Smoker    Quit date: 12/06/2011  . Smokeless tobacco: Never Used  . Alcohol Use: No    Allergies: No Known Allergies  Meds:  Prescriptions prior to admission  Medication Sig Dispense Refill  . butalbital-acetaminophen-caffeine (FIORICET WITH CODEINE) 50-325-40-30 MG per capsule Take 1 capsule by mouth every 4 (four) hours as needed. For headaches.      . lidocaine (XYLOCAINE) 2 % solution 5mL gargle swish and spit out every 3 hours as needed for pain. Do not swallow.  100 mL  0  . Prenatal Vit-Fe Fumarate-FA (PRENATAL MULTIVITAMIN) TABS Take 1 tablet by mouth at bedtime.           Physical Exam  Blood pressure 115/47, pulse 103, temperature 99.4 F (37.4 C), temperature source Oral, resp. rate 16, height 5\' 1"  (1.549 m), weight 63.05 kg (139 lb), last menstrual period 10/09/2011, SpO2 100.00%, not currently breastfeeding. GENERAL: Well-developed, well-nourished female in no acute distress.  HEENT:  normocephalic, good dentition HEART: normal rate RESP: normal effort ABDOMEN: Soft, nontender, nondistended, gravid.  EXTREMITIES: Nontender, no edema NEURO: alert and oriented  SPECULUM EXAM: Pelvic exam: Cervix pink, visually closed, without lesion, moderate white creamy discharge, vaginal walls and external genitalia normal,  FFN collected Bimanual exam: Cervix 0/long/high, firm, anterior   FHT:  Baseline 140 , moderate variability, accelerations present, no decelerations Contractions: irritability initially, then 30 minutes of contractions every 3 minutes, followed by occasional irregular cramping at time of D/C   Labs: Results for orders placed during the hospital encounter of 04/21/12 (from the past 24 hour(s))  URINALYSIS, ROUTINE W REFLEX MICROSCOPIC     Status: Normal   Collection Time   04/21/12  1:45 PM      Component Value Range   Color, Urine YELLOW  YELLOW    APPearance CLEAR  CLEAR    Specific Gravity, Urine 1.020  1.005 - 1.030    pH 6.5  5.0 - 8.0    Glucose, UA NEGATIVE  NEGATIVE (mg/dL)   Hgb urine dipstick NEGATIVE  NEGATIVE    Bilirubin Urine NEGATIVE  NEGATIVE    Ketones, ur NEGATIVE  NEGATIVE (mg/dL)   Protein, ur NEGATIVE  NEGATIVE (mg/dL)   Urobilinogen, UA 1.0  0.0 - 1.0 (mg/dL)   Nitrite NEGATIVE  NEGATIVE    Leukocytes, UA NEGATIVE  NEGATIVE   WET PREP, GENITAL     Status: Abnormal   Collection  Time   04/21/12  3:00 PM      Component Value Range   Yeast Wet Prep HPF POC NONE SEEN  NONE SEEN    Trich, Wet Prep NONE SEEN  NONE SEEN    Clue Cells Wet Prep HPF POC FEW (*) NONE SEEN    WBC, Wet Prep HPF POC FEW (*) NONE SEEN   FETAL FIBRONECTIN     Status: Normal   Collection Time   04/21/12  3:00 PM      Component Value Range   Fetal Fibronectin NEGATIVE  NEGATIVE      Assessment: Threatened preterm labor Bacterial Vaginosis   Plan: Called Dr Clearance Coots to discuss results and assessment Procardia 20 mg PO x1 dose in MAU D/C home with PTL  precautions Follow up with OB provider tomorrow Return to MAU as needed  [name] 5/30/20133:11 PM

## 2012-05-20 ENCOUNTER — Encounter (HOSPITAL_COMMUNITY): Payer: Self-pay | Admitting: *Deleted

## 2012-05-20 ENCOUNTER — Inpatient Hospital Stay (HOSPITAL_COMMUNITY)
Admission: AD | Admit: 2012-05-20 | Discharge: 2012-05-20 | Disposition: A | Discharge status: Home / Self Care | Payer: BC Managed Care – PPO | Source: Ambulatory Visit | Attending: Obstetrics & Gynecology | Admitting: Obstetrics & Gynecology

## 2012-05-20 DIAGNOSIS — R109 Unspecified abdominal pain: Secondary | ICD-10-CM | POA: Insufficient documentation

## 2012-05-20 DIAGNOSIS — O99891 Other specified diseases and conditions complicating pregnancy: Secondary | ICD-10-CM | POA: Insufficient documentation

## 2012-05-20 DIAGNOSIS — O479 False labor, unspecified: Secondary | ICD-10-CM

## 2012-05-20 DIAGNOSIS — N949 Unspecified condition associated with female genital organs and menstrual cycle: Secondary | ICD-10-CM | POA: Insufficient documentation

## 2012-05-20 MED ORDER — ACETAMINOPHEN 325 MG PO TABS: 650.0000 mg | ORAL_TABLET | Freq: Four times a day (QID) | ORAL | Status: DC | PRN

## 2012-05-20 NOTE — MAU Note (Signed)
While getting ready this morning, felt something trickling down leg, was mucous discharge, since then panties have been damp.  Has been feeling cramps lately. (irreg past 2 days)

## 2012-05-20 NOTE — Discharge Instructions (Signed)

## 2012-05-20 NOTE — MAU Provider Note (Signed)
  History     CSN: [id number]  Arrival date and time: 05/20/12 0945   First Provider Initiated Contact with Patient 05/20/12 1033      Chief Complaint  Patient presents with  . Vaginal Discharge   HPI States she has been cramping for the past 2 days. Intermittent sharp pains of short duration felt mostly in lower abdomen. Today at 08 100 was standing up and felt trickling fluid down her leg. She's not felt that again by underwear or have been moist since then. No gush of fluid. No vaginal bleeding. Good FM.    Past Medical History  Diagnosis Date  . Heart murmur   . Urinary tract infection   . Chlamydia ?2007    Past Surgical History  Procedure Date  . Cesarean section   . Laparoscopy for ectopic pregnancy Feb 2012  . Salpingectomy     Family History  Problem Relation Age of Onset  . Anesthesia problems Neg Hx   . Hypotension Neg Hx   . Malignant hyperthermia Neg Hx   . Pseudochol deficiency Neg Hx   . Other Neg Hx     History  Substance Use Topics  . Smoking status: Former Smoker    Quit date: 12/06/2011  . Smokeless tobacco: Never Used  . Alcohol Use: No    Allergies: No Known Allergies  Prescriptions prior to admission  Medication Sig Dispense Refill  . butalbital-acetaminophen-caffeine (FIORICET WITH CODEINE) 50-325-40-30 MG per capsule Take 1 capsule by mouth every 4 (four) hours as needed. For headaches.      . ferrous sulfate 325 (65 FE) MG tablet Take 325 mg by mouth daily with breakfast.      . Prenatal Vit-Fe Fumarate-FA (PRENATAL MULTIVITAMIN) TABS Take 1 tablet by mouth at bedtime.         ROS Pertinet items in HPI  Physical Exam   Blood pressure 115/65, pulse 99, temperature 98.8 F (37.1 C), temperature source Oral, resp. rate 18, last menstrual period 10/09/2011.  Physical Exam  Constitutional: She is oriented to person, place, and time. She appears well-developed and well-nourished. No distress.  HENT:  Head: Normocephalic.  Eyes:  Pupils are equal, round, and reactive to light.  Neck: Normal range of motion.  Cardiovascular: Normal rate.   Respiratory: Effort normal.  GI: Soft.       NT, size appropriate for gestational age  Genitourinary: Vagina normal.       Physiologic vaginal discharge with no pooling  Musculoskeletal: Normal range of motion.  Neurological: She is alert and oriented to person, place, and time.  Skin: Skin is warm and dry.  SSE: neg pool, scant discharge SVE: L/C/H FHR 140 reactive Toco: slight UI  MAU Course  Procedures Results for orders placed during the hospital encounter of 05/20/12 (from the past 24 hour(s))  AMNISURE RUPTURE OF MEMBRANE (ROM)     Status: Normal   Collection Time   05/20/12 11:00 AM      Component Value Range   Amnisure ROM NEGATIVE    POCT FERN TEST     Status: Normal   Collection Time   05/20/12 11:19 AM      Component Value Range   Fern Test Negative     MDM Without evidence of ROM  Assessment and Plan  Normal G3P0112 at [redacted]w[redacted]d No SROM or PTL -> discharge preterm labor precautions. Follow up at [facility] next week as scheduled  [name] 05/20/2012, 11:10 AM

## 2012-06-22 ENCOUNTER — Encounter (HOSPITAL_COMMUNITY): Payer: Self-pay | Admitting: *Deleted

## 2012-06-22 ENCOUNTER — Inpatient Hospital Stay (HOSPITAL_COMMUNITY)
Admission: AD | Admit: 2012-06-22 | Discharge: 2012-06-23 | Disposition: A | Discharge status: Home / Self Care | Payer: Medicaid Other | Source: Ambulatory Visit | Attending: Obstetrics & Gynecology | Admitting: Obstetrics & Gynecology

## 2012-06-22 DIAGNOSIS — O479 False labor, unspecified: Secondary | ICD-10-CM | POA: Insufficient documentation

## 2012-06-22 NOTE — MAU Note (Signed)
Pt states pain started about 1930 this

## 2012-06-22 NOTE — Progress Notes (Signed)
Pt states vaginal discharge has been more than usual today

## 2012-06-22 NOTE — MAU Note (Signed)
Reports contractions x 3 hours, white discharge.

## 2012-06-23 MED ORDER — ZOLPIDEM TARTRATE 5 MG PO TABS
5.0000 mg | ORAL_TABLET | Freq: Once | ORAL | Status: AC
  Administered 2012-06-23: 5 mg via ORAL
  Filled 2012-06-23: qty 1

## 2012-07-01 ENCOUNTER — Inpatient Hospital Stay (HOSPITAL_COMMUNITY)
Admission: AD | Admit: 2012-07-01 | Discharge: 2012-07-01 | Disposition: A | Discharge status: Home / Self Care | Payer: Medicaid Other | Source: Ambulatory Visit | Attending: Obstetrics | Admitting: Obstetrics

## 2012-07-01 ENCOUNTER — Encounter (HOSPITAL_COMMUNITY): Payer: Self-pay | Admitting: *Deleted

## 2012-07-01 DIAGNOSIS — O99891 Other specified diseases and conditions complicating pregnancy: Secondary | ICD-10-CM | POA: Insufficient documentation

## 2012-07-01 NOTE — MAU Note (Signed)
Pt presents with complaints of waking up this evening with dampness in her underwear. States she doesn't know if her water broke. States irregular contractions. Baby active

## 2012-07-01 NOTE — MAU Note (Signed)
Was wet when woke up, no big gushes but has been damp- clear.  No bleeding. Cramping, but nothing regular.

## 2012-07-05 ENCOUNTER — Telehealth (HOSPITAL_COMMUNITY): Payer: Self-pay | Admitting: *Deleted

## 2012-07-05 NOTE — Telephone Encounter (Signed)
Preadmission screen  

## 2012-07-06 ENCOUNTER — Telehealth (HOSPITAL_COMMUNITY): Payer: Self-pay | Admitting: *Deleted

## 2012-07-06 ENCOUNTER — Encounter (HOSPITAL_COMMUNITY): Payer: Self-pay | Admitting: *Deleted

## 2012-07-06 NOTE — Telephone Encounter (Signed)
Preadmission screen  

## 2012-07-07 ENCOUNTER — Institutional Professional Consult (permissible substitution): Payer: BC Managed Care – PPO | Admitting: Pediatrics

## 2012-07-13 ENCOUNTER — Telehealth (HOSPITAL_COMMUNITY): Payer: Self-pay | Admitting: *Deleted

## 2012-07-13 ENCOUNTER — Other Ambulatory Visit: Payer: Self-pay | Admitting: Obstetrics

## 2012-07-13 NOTE — Telephone Encounter (Signed)
Preadmission screen  

## 2012-07-19 ENCOUNTER — Encounter (HOSPITAL_COMMUNITY): Payer: Self-pay

## 2012-07-19 ENCOUNTER — Inpatient Hospital Stay (HOSPITAL_COMMUNITY)
Admission: RE | Admit: 2012-07-19 | Discharge: 2012-07-22 | DRG: 766 | Disposition: A | Discharge status: Home / Self Care | Payer: Medicaid Other | Source: Ambulatory Visit | Attending: Obstetrics | Admitting: Obstetrics

## 2012-07-19 DIAGNOSIS — O99892 Other specified diseases and conditions complicating childbirth: Secondary | ICD-10-CM | POA: Diagnosis present

## 2012-07-19 DIAGNOSIS — O34219 Maternal care for unspecified type scar from previous cesarean delivery: Secondary | ICD-10-CM | POA: Diagnosis present

## 2012-07-19 DIAGNOSIS — O480 Post-term pregnancy: Principal | ICD-10-CM | POA: Diagnosis present

## 2012-07-19 DIAGNOSIS — Z22330 Carrier of Group B streptococcus: Secondary | ICD-10-CM

## 2012-07-19 LAB — CBC
MCV: 88 fL (ref 78.0–100.0)
Platelets: 195 10*3/uL (ref 150–400)
RDW: 13.1 % (ref 11.5–15.5)
WBC: 5.8 10*3/uL (ref 4.0–10.5)

## 2012-07-19 LAB — TYPE AND SCREEN: ABO/RH(D): A NEG

## 2012-07-19 MED ORDER — IBUPROFEN 600 MG PO TABS: 600.0000 mg | ORAL_TABLET | Freq: Four times a day (QID) | ORAL | Status: DC | PRN

## 2012-07-19 MED ORDER — PROMETHAZINE HCL 25 MG/ML IJ SOLN
25.0000 mg | Freq: Four times a day (QID) | INTRAMUSCULAR | Status: DC | PRN
  Administered 2012-07-20: 25 mg via INTRAMUSCULAR
  Filled 2012-07-19: qty 1

## 2012-07-19 MED ORDER — TERBUTALINE SULFATE 1 MG/ML IJ SOLN: 0.2500 mg | Freq: Once | INTRAMUSCULAR | Status: AC | PRN

## 2012-07-19 MED ORDER — LACTATED RINGERS IV SOLN: 500.0000 mL | INTRAVENOUS | Status: DC | PRN

## 2012-07-19 MED ORDER — NALBUPHINE HCL 10 MG/ML IJ SOLN: 10.0000 mg | INTRAMUSCULAR | Status: DC | PRN

## 2012-07-19 MED ORDER — OXYTOCIN BOLUS FROM INFUSION
250.0000 mL | Freq: Once | INTRAVENOUS | Status: DC
  Filled 2012-07-19: qty 500

## 2012-07-19 MED ORDER — OXYCODONE-ACETAMINOPHEN 5-325 MG PO TABS: 1.0000 | ORAL_TABLET | ORAL | Status: DC | PRN

## 2012-07-19 MED ORDER — PENICILLIN G POTASSIUM 5000000 UNITS IJ SOLR
5.0000 10*6.[IU] | Freq: Once | INTRAVENOUS | Status: AC
  Administered 2012-07-20: 5 10*6.[IU] via INTRAVENOUS
  Filled 2012-07-19 (×2): qty 5

## 2012-07-19 MED ORDER — PENICILLIN G POTASSIUM 5000000 UNITS IJ SOLR
2.5000 10*6.[IU] | INTRAMUSCULAR | Status: DC
  Administered 2012-07-20: 2.5 10*6.[IU] via INTRAVENOUS
  Filled 2012-07-19 (×11): qty 2.5

## 2012-07-19 MED ORDER — OXYTOCIN 40 UNITS IN LACTATED RINGERS INFUSION - SIMPLE MED
1.0000 m[IU]/min | INTRAVENOUS | Status: DC
  Administered 2012-07-19: 1 m[IU]/min via INTRAVENOUS
  Administered 2012-07-20: 10 m[IU]/min via INTRAVENOUS

## 2012-07-19 MED ORDER — CITRIC ACID-SODIUM CITRATE 334-500 MG/5ML PO SOLN
30.0000 mL | ORAL | Status: DC | PRN
  Administered 2012-07-20: 30 mL via ORAL

## 2012-07-19 MED ORDER — LACTATED RINGERS IV SOLN
INTRAVENOUS | Status: DC
  Administered 2012-07-19 – 2012-07-20 (×3): via INTRAVENOUS

## 2012-07-19 MED ORDER — OXYTOCIN 40 UNITS IN LACTATED RINGERS INFUSION - SIMPLE MED: 62.5000 mL/h | Freq: Once | INTRAVENOUS | Status: DC

## 2012-07-19 MED ORDER — BUTORPHANOL TARTRATE 1 MG/ML IJ SOLN
1.0000 mg | INTRAMUSCULAR | Status: DC | PRN
  Administered 2012-07-20: 1 mg via INTRAVENOUS
  Filled 2012-07-19: qty 1

## 2012-07-19 MED ORDER — OXYTOCIN 40 UNITS IN LACTATED RINGERS INFUSION - SIMPLE MED
1.0000 m[IU]/min | INTRAVENOUS | Status: DC
  Filled 2012-07-19: qty 1000

## 2012-07-19 MED ORDER — ACETAMINOPHEN 325 MG PO TABS: 650.0000 mg | ORAL_TABLET | ORAL | Status: DC | PRN

## 2012-07-19 MED ORDER — ZOLPIDEM TARTRATE 5 MG PO TABS
5.0000 mg | ORAL_TABLET | Freq: Every evening | ORAL | Status: DC | PRN
  Administered 2012-07-19: 5 mg via ORAL
  Filled 2012-07-19: qty 1

## 2012-07-19 MED ORDER — LIDOCAINE HCL (PF) 1 % IJ SOLN: 30.0000 mL | INTRAMUSCULAR | Status: DC | PRN

## 2012-07-19 MED ORDER — ONDANSETRON HCL 4 MG/2ML IJ SOLN: 4.0000 mg | Freq: Four times a day (QID) | INTRAMUSCULAR | Status: DC | PRN

## 2012-07-19 MED ORDER — HYDROXYZINE HCL 50 MG PO TABS: 50.0000 mg | ORAL_TABLET | Freq: Four times a day (QID) | ORAL | Status: DC | PRN

## 2012-07-19 MED ORDER — NALBUPHINE HCL 10 MG/ML IJ SOLN: 10.0000 mg | Freq: Four times a day (QID) | INTRAMUSCULAR | Status: DC | PRN

## 2012-07-19 MED ORDER — HYDROXYZINE HCL 50 MG/ML IM SOLN: 50.0000 mg | Freq: Four times a day (QID) | INTRAMUSCULAR | Status: DC | PRN

## 2012-07-19 NOTE — H&P (Signed)
[name] is a 26 y.o. female presenting for IOL. Maternal Medical History:  Reason for admission: 26 yo G3 P0112.  EDC 07-15-12.  Presents for IOL for postdates.  H/O previous C/S for twins and FTP.  Desires VBAC.  Fetal activity: Perceived fetal activity is normal.   Last perceived fetal movement was within the past hour.    Prenatal complications: no prenatal complications   OB History    Grav Para Term Preterm Abortions TAB SAB Ect Mult Living   3 1 0 1 1 0 0 1 1 2      Past Medical History  Diagnosis Date  . Heart murmur   . Urinary tract infection   . Chlamydia ?2007   Past Surgical History  Procedure Date  . Cesarean section   . Laparoscopy for ectopic pregnancy Feb 2012  . Salpingectomy    Family History: family history is negative for Anesthesia problems, and Hypotension, and Malignant hyperthermia, and Pseudochol deficiency, and Other, . Social History:  reports that she quit smoking about 7 months ago. She has never used smokeless tobacco. She reports that she does not drink alcohol or use illicit drugs.   Prenatal Transfer Tool  Maternal Diabetes: No Genetic Screening: Normal Maternal Ultrasounds/Referrals: Normal Fetal Ultrasounds or other Referrals:  None Maternal Substance Abuse:  No Significant Maternal Medications:  Meds include: Other:  Significant Maternal Lab Results:  Lab values include: Group B Strep positive Other Comments:  None  Review of Systems  All other systems reviewed and are negative.      Blood pressure 134/62, pulse 87, temperature 98.5 F (36.9 C), temperature source Oral, resp. rate 20, height 5\' 2"  (1.575 m), weight 158 lb (71.668 kg), last menstrual period 10/09/2011. Maternal Exam:  Abdomen: Surgical scars: low transverse.   Fetal presentation: vertex  Introitus: Normal vulva. Normal vagina.  Pelvis: adequate for delivery.   Cervix: Cervix evaluated by digital exam.     Physical Exam  Nursing note and vitals  reviewed. Constitutional: She is oriented to person, place, and time. She appears well-developed and well-nourished.  HENT:  Head: Normocephalic and atraumatic.  Eyes: Conjunctivae are normal. Pupils are equal, round, and reactive to light.  Neck: Normal range of motion. Neck supple.  Cardiovascular: Normal rate and regular rhythm.   Respiratory: Effort normal.  GI: Soft.  Genitourinary: Vagina normal and uterus normal.  Musculoskeletal: Normal range of motion.  Neurological: She is alert and oriented to person, place, and time.  Skin: Skin is warm and dry.  Psychiatric: She has a normal mood and affect. Her behavior is normal. Judgment and thought content normal.    Prenatal labs: ABO, Rh: --/--/A NEG (05/27 1010) Antibody: NEG (05/27 1010) Rubella: Immune (01/22 0000) RPR: Nonreactive (01/22 0000)  HBsAg: Negative (01/22 0000)  HIV: Non-reactive (01/22 0000)  GBS: Positive (08/01 0000)   Assessment/Plan: 40.4 weeks.  Postdates.  VBAC.  2 stage IOL.   HARPER,CHARLES A 07/19/2012, 8:53 AM

## 2012-07-19 NOTE — Progress Notes (Signed)
Spoke with Dr Clearance Coots on the phone. Informed him that pts foley bulb was still in after 12hrs. Questioned whether to leave in & start pitocin or to go ahead & start pitocin. Order received to deflate foley bulb, remove & start pitocin at 59mu/min & increase by 79mu/min, not to go above 76mu/min.

## 2012-07-19 NOTE — Progress Notes (Signed)
[name] is a 26 y.o. [id number] at [redacted]w[redacted]d by LMP admitted for induction of labor due to Post dates. Due date 07-15-12.    Subjective:   Objective: BP 134/62  Pulse 87  Temp 98.5 F (36.9 C) (Oral)  Resp 20  Ht 5\' 2"  (1.575 m)  Wt 158 lb (71.668 kg)  BMI 28.90 kg/m2  LMP 10/09/2011      FHT:  FHR: 150 bpm, variability: minimal ,  accelerations:  Present,  decelerations:  Absent UC:   none SVE:   Dilation: Fingertip Effacement (%): Thick Station: Ballotable Exam by:: hk (vtx by u/s)  Labs: Lab Results  Component Value Date   WBC 5.8 07/19/2012   HGB 9.8* 07/19/2012   HCT 30.1* 07/19/2012   MCV 88.0 07/19/2012   PLT 195 07/19/2012    Assessment / Plan: Induction of labor due to postterm,  progressing well on pitocin  Labor: 2 stage IOL Preeclampsia:  n/a Fetal Wellbeing:  Category I Pain Control:  Labor support without medications I/D:  n/a Anticipated MOD:  NSVD, VBAC  Jarrah Babich A 07/19/2012, 10:19 AM

## 2012-07-20 ENCOUNTER — Encounter (HOSPITAL_COMMUNITY): Payer: Self-pay | Admitting: Anesthesiology

## 2012-07-20 ENCOUNTER — Encounter (HOSPITAL_COMMUNITY)
Admission: RE | Disposition: A | Discharge status: Home / Self Care | Payer: Self-pay | Source: Ambulatory Visit | Attending: Obstetrics

## 2012-07-20 ENCOUNTER — Encounter (HOSPITAL_COMMUNITY): Payer: Self-pay

## 2012-07-20 ENCOUNTER — Inpatient Hospital Stay (HOSPITAL_COMMUNITY): Payer: Medicaid Other | Admitting: Anesthesiology

## 2012-07-20 SURGERY — Surgical Case
Anesthesia: Epidural | Site: Abdomen | Wound class: Clean Contaminated

## 2012-07-20 MED ORDER — DIPHENHYDRAMINE HCL 25 MG PO CAPS
25.0000 mg | ORAL_CAPSULE | Freq: Four times a day (QID) | ORAL | Status: DC | PRN
  Filled 2012-07-20: qty 1

## 2012-07-20 MED ORDER — IBUPROFEN 600 MG PO TABS: 600.0000 mg | ORAL_TABLET | Freq: Four times a day (QID) | ORAL | Status: DC | PRN

## 2012-07-20 MED ORDER — SODIUM BICARBONATE 8.4 % IV SOLN
INTRAVENOUS | Status: AC
  Filled 2012-07-20: qty 50

## 2012-07-20 MED ORDER — DIPHENHYDRAMINE HCL 25 MG PO CAPS
25.0000 mg | ORAL_CAPSULE | ORAL | Status: DC | PRN
  Administered 2012-07-20 – 2012-07-21 (×4): 25 mg via ORAL
  Filled 2012-07-20 (×5): qty 1

## 2012-07-20 MED ORDER — LACTATED RINGERS IV SOLN
INTRAVENOUS | Status: DC | PRN
  Administered 2012-07-20: 17:00:00 via INTRAVENOUS

## 2012-07-20 MED ORDER — DIPHENHYDRAMINE HCL 50 MG/ML IJ SOLN
12.5000 mg | INTRAMUSCULAR | Status: DC | PRN
  Administered 2012-07-21: 12.5 mg via INTRAVENOUS
  Filled 2012-07-20: qty 1

## 2012-07-20 MED ORDER — ONDANSETRON HCL 4 MG/2ML IJ SOLN
INTRAMUSCULAR | Status: AC
  Filled 2012-07-20: qty 2

## 2012-07-20 MED ORDER — OXYTOCIN 10 UNIT/ML IJ SOLN
40.0000 [IU] | INTRAVENOUS | Status: DC | PRN
  Administered 2012-07-20: 40 [IU] via INTRAVENOUS

## 2012-07-20 MED ORDER — DIBUCAINE 1 % RE OINT
1.0000 "application " | TOPICAL_OINTMENT | RECTAL | Status: DC | PRN
  Filled 2012-07-20: qty 28

## 2012-07-20 MED ORDER — KETOROLAC TROMETHAMINE 60 MG/2ML IM SOLN
INTRAMUSCULAR | Status: AC
  Administered 2012-07-20: 60 mg via INTRAMUSCULAR
  Filled 2012-07-20: qty 2

## 2012-07-20 MED ORDER — IBUPROFEN 600 MG PO TABS
600.0000 mg | ORAL_TABLET | Freq: Four times a day (QID) | ORAL | Status: DC
  Administered 2012-07-21 – 2012-07-22 (×6): 600 mg via ORAL
  Filled 2012-07-20 (×6): qty 1

## 2012-07-20 MED ORDER — SENNOSIDES-DOCUSATE SODIUM 8.6-50 MG PO TABS
2.0000 | ORAL_TABLET | Freq: Every day | ORAL | Status: DC
  Administered 2012-07-20 – 2012-07-21 (×2): 2 via ORAL

## 2012-07-20 MED ORDER — LANOLIN HYDROUS EX OINT: 1.0000 "application " | TOPICAL_OINTMENT | CUTANEOUS | Status: DC | PRN

## 2012-07-20 MED ORDER — PRENATAL MULTIVITAMIN CH
1.0000 | ORAL_TABLET | Freq: Every day | ORAL | Status: DC
  Administered 2012-07-21 – 2012-07-22 (×2): 1 via ORAL
  Filled 2012-07-20 (×3): qty 1

## 2012-07-20 MED ORDER — ZOLPIDEM TARTRATE 5 MG PO TABS: 5.0000 mg | ORAL_TABLET | Freq: Every evening | ORAL | Status: DC | PRN

## 2012-07-20 MED ORDER — NALBUPHINE SYRINGE 5 MG/0.5 ML
INJECTION | INTRAMUSCULAR | Status: AC
  Filled 2012-07-20: qty 1

## 2012-07-20 MED ORDER — FENTANYL CITRATE 0.05 MG/ML IJ SOLN: 25.0000 ug | INTRAMUSCULAR | Status: DC | PRN

## 2012-07-20 MED ORDER — CITRIC ACID-SODIUM CITRATE 334-500 MG/5ML PO SOLN
ORAL | Status: AC
  Filled 2012-07-20: qty 15

## 2012-07-20 MED ORDER — SIMETHICONE 80 MG PO CHEW
80.0000 mg | CHEWABLE_TABLET | Freq: Three times a day (TID) | ORAL | Status: DC
  Administered 2012-07-20 – 2012-07-22 (×7): 80 mg via ORAL

## 2012-07-20 MED ORDER — CEFAZOLIN SODIUM-DEXTROSE 2-3 GM-% IV SOLR: INTRAVENOUS | Status: DC | PRN

## 2012-07-20 MED ORDER — SODIUM CHLORIDE 0.9 % IJ SOLN: 3.0000 mL | INTRAMUSCULAR | Status: DC | PRN

## 2012-07-20 MED ORDER — FENTANYL 2.5 MCG/ML BUPIVACAINE 1/10 % EPIDURAL INFUSION (WH - ANES)
14.0000 mL/h | INTRAMUSCULAR | Status: DC
  Administered 2012-07-20: 14 mL/h via EPIDURAL
  Filled 2012-07-20: qty 60

## 2012-07-20 MED ORDER — LACTATED RINGERS IV SOLN
500.0000 mL | Freq: Once | INTRAVENOUS | Status: AC
  Administered 2012-07-20: 500 mL via INTRAVENOUS

## 2012-07-20 MED ORDER — MEPERIDINE HCL 25 MG/ML IJ SOLN: 6.2500 mg | INTRAMUSCULAR | Status: DC | PRN

## 2012-07-20 MED ORDER — PHENYLEPHRINE 40 MCG/ML (10ML) SYRINGE FOR IV PUSH (FOR BLOOD PRESSURE SUPPORT)
PREFILLED_SYRINGE | INTRAVENOUS | Status: AC
  Filled 2012-07-20: qty 5

## 2012-07-20 MED ORDER — NALBUPHINE HCL 10 MG/ML IJ SOLN
5.0000 mg | INTRAMUSCULAR | Status: DC | PRN
  Administered 2012-07-20 – 2012-07-21 (×3): 5 mg via INTRAVENOUS
  Filled 2012-07-20: qty 1

## 2012-07-20 MED ORDER — LIDOCAINE-EPINEPHRINE (PF) 2 %-1:200000 IJ SOLN
INTRAMUSCULAR | Status: DC | PRN
  Administered 2012-07-20: 5 mL via EPIDURAL

## 2012-07-20 MED ORDER — ONDANSETRON HCL 4 MG/2ML IJ SOLN
INTRAMUSCULAR | Status: DC | PRN
  Administered 2012-07-20: 4 mg via INTRAVENOUS

## 2012-07-20 MED ORDER — NALOXONE HCL 0.4 MG/ML IJ SOLN: 0.4000 mg | INTRAMUSCULAR | Status: DC | PRN

## 2012-07-20 MED ORDER — NALBUPHINE SYRINGE 5 MG/0.5 ML
10.0000 mg | INJECTION | INTRAMUSCULAR | Status: DC | PRN
  Administered 2012-07-20: 10 mg via INTRAVENOUS

## 2012-07-20 MED ORDER — NALBUPHINE HCL 10 MG/ML IJ SOLN
5.0000 mg | INTRAMUSCULAR | Status: DC | PRN
  Administered 2012-07-21: 5 mg via SUBCUTANEOUS
  Filled 2012-07-20: qty 1

## 2012-07-20 MED ORDER — PHENYLEPHRINE 40 MCG/ML (10ML) SYRINGE FOR IV PUSH (FOR BLOOD PRESSURE SUPPORT)
80.0000 ug | PREFILLED_SYRINGE | INTRAVENOUS | Status: DC | PRN
  Filled 2012-07-20: qty 5

## 2012-07-20 MED ORDER — WITCH HAZEL-GLYCERIN EX PADS: 1.0000 "application " | MEDICATED_PAD | CUTANEOUS | Status: DC | PRN

## 2012-07-20 MED ORDER — MORPHINE SULFATE (PF) 0.5 MG/ML IJ SOLN
INTRAMUSCULAR | Status: DC | PRN
  Administered 2012-07-20: 4 mg via EPIDURAL

## 2012-07-20 MED ORDER — LIDOCAINE HCL (PF) 1 % IJ SOLN
INTRAMUSCULAR | Status: DC | PRN
  Administered 2012-07-20 (×3): 4 mL

## 2012-07-20 MED ORDER — TETANUS-DIPHTH-ACELL PERTUSSIS 5-2.5-18.5 LF-MCG/0.5 IM SUSP
0.5000 mL | Freq: Once | INTRAMUSCULAR | Status: DC
  Filled 2012-07-20: qty 0.5

## 2012-07-20 MED ORDER — KETOROLAC TROMETHAMINE 30 MG/ML IJ SOLN: 30.0000 mg | Freq: Four times a day (QID) | INTRAMUSCULAR | Status: AC | PRN

## 2012-07-20 MED ORDER — MORPHINE SULFATE 0.5 MG/ML IJ SOLN
INTRAMUSCULAR | Status: AC
  Filled 2012-07-20: qty 10

## 2012-07-20 MED ORDER — NALBUPHINE SYRINGE 5 MG/0.5 ML
10.0000 mg | INJECTION | Freq: Four times a day (QID) | INTRAMUSCULAR | Status: DC | PRN
  Administered 2012-07-20: 10 mg via INTRAMUSCULAR

## 2012-07-20 MED ORDER — MENTHOL 3 MG MT LOZG
1.0000 | LOZENGE | OROMUCOSAL | Status: DC | PRN
  Filled 2012-07-20: qty 9

## 2012-07-20 MED ORDER — SIMETHICONE 80 MG PO CHEW: 80.0000 mg | CHEWABLE_TABLET | ORAL | Status: DC | PRN

## 2012-07-20 MED ORDER — OXYCODONE-ACETAMINOPHEN 5-325 MG PO TABS
1.0000 | ORAL_TABLET | ORAL | Status: DC | PRN
  Administered 2012-07-21 (×2): 1 via ORAL
  Filled 2012-07-20 (×2): qty 1

## 2012-07-20 MED ORDER — MEPERIDINE HCL 25 MG/ML IJ SOLN
INTRAMUSCULAR | Status: DC | PRN
  Administered 2012-07-20: 12.5 mg via INTRAVENOUS

## 2012-07-20 MED ORDER — ONDANSETRON HCL 4 MG/2ML IJ SOLN: 4.0000 mg | Freq: Three times a day (TID) | INTRAMUSCULAR | Status: DC | PRN

## 2012-07-20 MED ORDER — NALBUPHINE HCL 10 MG/ML IJ SOLN: 10.0000 mg | Freq: Four times a day (QID) | INTRAMUSCULAR | Status: DC | PRN

## 2012-07-20 MED ORDER — CEFAZOLIN SODIUM-DEXTROSE 2-3 GM-% IV SOLR
2.0000 g | Freq: Once | INTRAVENOUS | Status: AC
  Administered 2012-07-20 (×2): 2 g via INTRAVENOUS
  Filled 2012-07-20: qty 50

## 2012-07-20 MED ORDER — DIPHENHYDRAMINE HCL 50 MG/ML IJ SOLN: 12.5000 mg | INTRAMUSCULAR | Status: DC | PRN

## 2012-07-20 MED ORDER — PHENYLEPHRINE 40 MCG/ML (10ML) SYRINGE FOR IV PUSH (FOR BLOOD PRESSURE SUPPORT): 80.0000 ug | PREFILLED_SYRINGE | INTRAVENOUS | Status: DC | PRN

## 2012-07-20 MED ORDER — ONDANSETRON HCL 4 MG/2ML IJ SOLN: 4.0000 mg | INTRAMUSCULAR | Status: DC | PRN

## 2012-07-20 MED ORDER — LIDOCAINE-EPINEPHRINE (PF) 2 %-1:200000 IJ SOLN
INTRAMUSCULAR | Status: AC
  Filled 2012-07-20: qty 20

## 2012-07-20 MED ORDER — LACTATED RINGERS IV SOLN
INTRAVENOUS | Status: DC
  Administered 2012-07-21: 05:00:00 via INTRAVENOUS

## 2012-07-20 MED ORDER — ONDANSETRON HCL 4 MG PO TABS: 4.0000 mg | ORAL_TABLET | ORAL | Status: DC | PRN

## 2012-07-20 MED ORDER — KETOROLAC TROMETHAMINE 60 MG/2ML IM SOLN
60.0000 mg | Freq: Once | INTRAMUSCULAR | Status: AC | PRN
  Administered 2012-07-20: 60 mg via INTRAMUSCULAR

## 2012-07-20 MED ORDER — OXYTOCIN 40 UNITS IN LACTATED RINGERS INFUSION - SIMPLE MED: 62.5000 mL/h | INTRAVENOUS | Status: AC

## 2012-07-20 MED ORDER — PHENYLEPHRINE HCL 10 MG/ML IJ SOLN
INTRAMUSCULAR | Status: DC | PRN
  Administered 2012-07-20: 40 ug via INTRAVENOUS

## 2012-07-20 MED ORDER — SCOPOLAMINE 1 MG/3DAYS TD PT72
1.0000 | MEDICATED_PATCH | Freq: Once | TRANSDERMAL | Status: DC
  Administered 2012-07-20: 1.5 mg via TRANSDERMAL

## 2012-07-20 MED ORDER — DIPHENHYDRAMINE HCL 50 MG/ML IJ SOLN: 25.0000 mg | INTRAMUSCULAR | Status: DC | PRN

## 2012-07-20 MED ORDER — OXYTOCIN 10 UNIT/ML IJ SOLN
INTRAMUSCULAR | Status: AC
  Filled 2012-07-20: qty 4

## 2012-07-20 MED ORDER — SCOPOLAMINE 1 MG/3DAYS TD PT72
MEDICATED_PATCH | TRANSDERMAL | Status: AC
  Filled 2012-07-20: qty 1

## 2012-07-20 MED ORDER — EPHEDRINE 5 MG/ML INJ: 10.0000 mg | INTRAVENOUS | Status: DC | PRN

## 2012-07-20 MED ORDER — EPHEDRINE 5 MG/ML INJ
10.0000 mg | INTRAVENOUS | Status: DC | PRN
  Filled 2012-07-20: qty 4

## 2012-07-20 MED ORDER — MEDROXYPROGESTERONE ACETATE 150 MG/ML IM SUSP: 150.0000 mg | INTRAMUSCULAR | Status: DC | PRN

## 2012-07-20 MED ORDER — SODIUM CHLORIDE 0.9 % IV SOLN: 1.0000 ug/kg/h | INTRAVENOUS | Status: DC | PRN

## 2012-07-20 MED ORDER — METOCLOPRAMIDE HCL 5 MG/ML IJ SOLN: 10.0000 mg | Freq: Three times a day (TID) | INTRAMUSCULAR | Status: DC | PRN

## 2012-07-20 MED ORDER — MEPERIDINE HCL 25 MG/ML IJ SOLN
INTRAMUSCULAR | Status: AC
  Filled 2012-07-20: qty 1

## 2012-07-20 SURGICAL SUPPLY — 44 items
APL SKNCLS STERI-STRIP NONHPOA (GAUZE/BANDAGES/DRESSINGS) ×1
BENZOIN TINCTURE PRP APPL 2/3 (GAUZE/BANDAGES/DRESSINGS) ×2 IMPLANT
CANISTER WOUND CARE 500ML ATS (WOUND CARE) IMPLANT
CHLORAPREP W/TINT 26ML (MISCELLANEOUS) ×2 IMPLANT
CLOTH BEACON ORANGE TIMEOUT ST (SAFETY) ×2 IMPLANT
CONTAINER PREFILL 10% NBF 15ML (MISCELLANEOUS) IMPLANT
DRESSING TELFA 8X3 (GAUZE/BANDAGES/DRESSINGS) ×2 IMPLANT
DRSG COVADERM 4X10 (GAUZE/BANDAGES/DRESSINGS) ×1 IMPLANT
DRSG VAC ATS LRG SENSATRAC (GAUZE/BANDAGES/DRESSINGS) IMPLANT
DRSG VAC ATS MED SENSATRAC (GAUZE/BANDAGES/DRESSINGS) IMPLANT
DRSG VAC ATS SM SENSATRAC (GAUZE/BANDAGES/DRESSINGS) IMPLANT
ELECT REM PT RETURN 9FT ADLT (ELECTROSURGICAL) ×2
ELECTRODE REM PT RTRN 9FT ADLT (ELECTROSURGICAL) ×1 IMPLANT
EXTRACTOR VACUUM M CUP 4 TUBE (SUCTIONS) IMPLANT
GAUZE SPONGE 4X4 12PLY STRL LF (GAUZE/BANDAGES/DRESSINGS) ×4 IMPLANT
GLOVE BIO SURGEON STRL SZ 6.5 (GLOVE) ×4 IMPLANT
GOWN PREVENTION PLUS LG XLONG (DISPOSABLE) ×6 IMPLANT
KIT ABG SYR 3ML LUER SLIP (SYRINGE) IMPLANT
NDL HYPO 25X5/8 SAFETYGLIDE (NEEDLE) ×1 IMPLANT
NEEDLE HYPO 25X5/8 SAFETYGLIDE (NEEDLE) IMPLANT
NS IRRIG 1000ML POUR BTL (IV SOLUTION) ×2 IMPLANT
PACK C SECTION WH (CUSTOM PROCEDURE TRAY) ×2 IMPLANT
PAD ABD 7.5X8 STRL (GAUZE/BANDAGES/DRESSINGS) ×2 IMPLANT
PAD OB MATERNITY 4.3X12.25 (PERSONAL CARE ITEMS) ×1 IMPLANT
RTRCTR C-SECT PINK 25CM LRG (MISCELLANEOUS) IMPLANT
SLEEVE SCD COMPRESS KNEE MED (MISCELLANEOUS) IMPLANT
SPONGE GAUZE 4X4 12PLY (GAUZE/BANDAGES/DRESSINGS) ×1 IMPLANT
STAPLER VISISTAT 35W (STAPLE) IMPLANT
STRIP CLOSURE SKIN 1/2X4 (GAUZE/BANDAGES/DRESSINGS) ×2 IMPLANT
SUT MNCRL 0 VIOLET CTX 36 (SUTURE) ×2 IMPLANT
SUT MNCRL AB 3-0 PS2 27 (SUTURE) IMPLANT
SUT MONOCRYL 0 CTX 36 (SUTURE) ×2
SUT PDS AB 0 CTX 36 PDP370T (SUTURE) ×2 IMPLANT
SUT PLAIN 0 NONE (SUTURE) IMPLANT
SUT VIC AB 0 CTXB 36 (SUTURE) IMPLANT
SUT VIC AB 2-0 CT1 (SUTURE) ×2 IMPLANT
SUT VIC AB 2-0 CT1 27 (SUTURE) ×2
SUT VIC AB 2-0 CT1 TAPERPNT 27 (SUTURE) ×1 IMPLANT
SUT VIC AB 2-0 SH 27 (SUTURE)
SUT VIC AB 2-0 SH 27XBRD (SUTURE) IMPLANT
TAPE CLOTH SURG 4X10 WHT LF (GAUZE/BANDAGES/DRESSINGS) ×1 IMPLANT
TOWEL OR 17X24 6PK STRL BLUE (TOWEL DISPOSABLE) ×4 IMPLANT
TRAY FOLEY CATH 14FR (SET/KITS/TRAYS/PACK) ×1 IMPLANT
WATER STERILE IRR 1000ML POUR (IV SOLUTION) ×1 IMPLANT

## 2012-07-20 NOTE — Progress Notes (Signed)
JENAYE RICKERT is a 26 y.o. [id number] at [redacted]w[redacted]d by LMP admitted for induction of labor due to Post dates. Due date 07-15-12.  Subjective:   Objective: BP 122/62  Pulse 71  Temp 97.9 F (36.6 C) (Oral)  Resp 18  Ht 5\' 2"  (1.575 m)  Wt 158 lb (71.668 kg)  BMI 28.90 kg/m2  LMP 10/09/2011      FHT:  FHR: 150 bpm, variability: moderate,  accelerations:  Present,  decelerations:  Absent UC:   regular, every 3-5 minutes SVE:   Dilation: 2.5 Effacement (%): 60;70 Station: -2 Exam by:: l.poore, rn  Labs: Lab Results  Component Value Date   WBC 5.8 07/19/2012   HGB 9.8* 07/19/2012   HCT 30.1* 07/19/2012   MCV 88.0 07/19/2012   PLT 195 07/19/2012    Assessment / Plan: Induction of labor due to postterm,  progressing well on pitocin  Labor: Progressing on Pitocin, will continue to increase then AROM Preeclampsia:  n/a Fetal Wellbeing:  Category I Pain Control:  Labor support without medications I/D:  n/a Anticipated MOD:  NSVD  HARPER,CHARLES A 07/20/2012, 7:19 AM

## 2012-07-20 NOTE — Transfer of Care (Signed)
Immediate Anesthesia Transfer of Care Note  Patient: [name]  Procedure(s) Performed: Procedure(s) (LRB): CESAREAN SECTION (N/A)  Patient Location: PACU  Anesthesia Type: Epidural  Level of Consciousness: awake, alert  and oriented  Airway & Oxygen Therapy: Patient Spontanous Breathing  Post-op Assessment: Report given to PACU RN and Post -op Vital signs reviewed and stable  Post vital signs: Reviewed and stable  Complications: No apparent anesthesia complications

## 2012-07-20 NOTE — Op Note (Signed)
Cesarean Section Procedure Note   [facility]   07/19/2012 - 07/20/2012  Indications: Failed VBAC   Pre-operative Diagnosis: Previous Cesarean Section, Protracted latent phase  Post-operative Diagnosis: Same   Surgeon: Antionette Char A  Assistants: Coral Ceo A  Anesthesia: epidural  Procedure Details:  The patient was seen in the Holding Room. The risks, benefits, complications, treatment options, and expected outcomes were discussed with the patient. The patient concurred with the proposed plan, giving informed consent. The patient was identified as [name] and the procedure verified as C-Section Delivery. A Time Out was held and the above information confirmed.  After induction of anesthesia, the patient was draped and prepped in the usual sterile manner. A transverse incision was made and carried down through the subcutaneous tissue to the fascia. The fascial incision was made and extended transversely. The fascia was separated from the underlying rectus tissue superiorly and inferiorly. The peritoneum was identified and entered. The peritoneal incision was extended longitudinally. The utero-vesical peritoneal reflection was incised transversely and the bladder flap was bluntly freed from the lower uterine segment. A low transverse uterine incision was made. Delivered from cephalic presentation was a living newborn.  A cord ph was not sent. The umbilical cord was clamped and cut cord. The placenta was removed Intact and appeared normal.  The uterine incision was closed with running locked sutures of 1-0 Monocryl. A second imbricating layer of the same suture was placed.  Hemostasis was observed. The paracolic gutters were irrigated. The fascia was then reapproximated with running sutures of 0-PDS.. The parieto peritoneum was closed in a running fashion with 2-0 Vicryl.  The subcuticular closure was performed using 3-0 Monocryl.  Instrument, sponge, and needle counts  were correct prior the abdominal closure and were correct at the conclusion of the case.    Findings:  See above.  Nuchal cord x 1   Estimated Blood Loss: 600 ml  Total IV Fluids: per Anesthesiology  Urine Output: per Anesthesiology  Specimens: none  Complications: no complications  Disposition: PACU - hemodynamically stable.  Maternal Condition: stable   Baby condition / location:  nursery-stable    Signed: Surgeon(s): Antionette Char, MD Brock Bad, MD

## 2012-07-20 NOTE — Anesthesia Procedure Notes (Signed)
Epidural Patient location during procedure: OB Start time: 07/20/2012 2:23 PM Reason for block: procedure for pain  Staffing Performed by: anesthesiologist   Preanesthetic Checklist Completed: patient identified, site marked, surgical consent, pre-op evaluation, timeout performed, IV checked, risks and benefits discussed and monitors and equipment checked  Epidural Patient position: sitting Prep: site prepped and draped and DuraPrep Patient monitoring: continuous pulse ox and blood pressure Approach: midline Injection technique: LOR air  Needle:  Needle type: Tuohy  Needle gauge: 17 G Needle length: 9 cm Needle insertion depth: 5 cm cm Catheter type: closed end flexible Catheter size: 19 Gauge Catheter at skin depth: 10 cm Test dose: negative  Assessment Events: blood not aspirated, injection not painful, no injection resistance, negative IV test and no paresthesia  Additional Notes Discussed risk of headache, infection, bleeding, nerve injury and failed or incomplete block.  Patient voices understanding and wishes to proceed.

## 2012-07-20 NOTE — Anesthesia Postprocedure Evaluation (Signed)
Anesthesia Post Note  Patient: [name]  Procedure(s) Performed: Procedure(s) (LRB): CESAREAN SECTION (N/A)  Anesthesia type: Spinal  Patient location: PACU  Post pain: Pain level controlled  Post assessment: Post-op Vital signs reviewed  Last Vitals:  Filed Vitals:   07/20/12 1815  BP: 111/56  Pulse: 86  Temp:   Resp: 20    Post vital signs: Reviewed  Level of consciousness: awake  Complications: No apparent anesthesia complications

## 2012-07-20 NOTE — Anesthesia Preprocedure Evaluation (Addendum)
Anesthesia Evaluation  Patient identified by MRN, date of birth, ID band Patient awake    Reviewed: Allergy & Precautions, H&P , NPO status , Patient's Chart, lab work & pertinent test results, reviewed documented beta blocker date and time   History of Anesthesia Complications Negative for: history of anesthetic complications  Airway Mallampati: I TM Distance: >3 FB Neck ROM: full    Dental  (+) Teeth Intact   Pulmonary asthma (last inhaler use months ago, no hospitalizations) ,  breath sounds clear to auscultation        Cardiovascular negative cardio ROS  Rhythm:regular Rate:Normal     Neuro/Psych negative neurological ROS  negative psych ROS   GI/Hepatic negative GI ROS, Neg liver ROS,   Endo/Other  negative endocrine ROS  Renal/GU negative Renal ROS     Musculoskeletal   Abdominal   Peds  Hematology negative hematology ROS (+)   Anesthesia Other Findings   Reproductive/Obstetrics (+) Pregnancy (h/o c/s x1)                          Anesthesia Physical Anesthesia Plan  ASA: II  Anesthesia Plan: Epidural   Post-op Pain Management:    Induction:   Airway Management Planned:   Additional Equipment:   Intra-op Plan:   Post-operative Plan:   Informed Consent: I have reviewed the patients History and Physical, chart, labs and discussed the procedure including the risks, benefits and alternatives for the proposed anesthesia with the patient or authorized representative who has indicated his/her understanding and acceptance.     Plan Discussed with:   Anesthesia Plan Comments:         Anesthesia Quick Evaluation

## 2012-07-21 ENCOUNTER — Encounter (HOSPITAL_COMMUNITY): Payer: Self-pay

## 2012-07-21 LAB — CBC
Hemoglobin: 9.6 g/dL — ABNORMAL LOW (ref 12.0–15.0)
Platelets: 179 10*3/uL (ref 150–400)
RBC: 3.34 MIL/uL — ABNORMAL LOW (ref 3.87–5.11)
WBC: 11.9 10*3/uL — ABNORMAL HIGH (ref 4.0–10.5)

## 2012-07-21 MED ORDER — RHO D IMMUNE GLOBULIN 1500 UNIT/2ML IJ SOLN
300.0000 ug | Freq: Once | INTRAMUSCULAR | Status: AC
  Administered 2012-07-21: 300 ug via INTRAMUSCULAR
  Filled 2012-07-21: qty 2

## 2012-07-21 NOTE — Progress Notes (Signed)
Subjective: Postpartum Day 1: Cesarean Delivery Patient reports tolerating PO.    Objective: Vital signs in last 24 hours: Temp:  [97.9 F (36.6 C)-98.9 F (37.2 C)] 97.9 F (36.6 C) (08/29 0500) Pulse Rate:  [66-101] 95  (08/29 0500) Resp:  [18-20] 20  (08/29 0500) BP: (99-133)/(42-84) 99/62 mmHg (08/29 0500) SpO2:  [95 %-100 %] 97 % (08/29 0500)  Physical Exam:  General: alert and no distress Lochia: appropriate Uterine Fundus: firm Incision: healing well DVT Evaluation: No evidence of DVT seen on physical exam.   Basename 07/21/12 0550 07/19/12 0830  HGB 9.6* 9.8*  HCT 29.8* 30.1*    Assessment/Plan: Status post Cesarean section. Doing well postoperatively.  Continue current care.  Ryah Cribb A 07/21/2012, 8:27 AM

## 2012-07-21 NOTE — Addendum Note (Signed)
Addendum  created 07/21/12 1610 by Suella Grove, CRNA   Modules edited:Notes Section

## 2012-07-21 NOTE — Anesthesia Postprocedure Evaluation (Signed)
  Anesthesia Post-op Note  Patient: [name]  Procedure(s) Performed: Procedure(s) (LRB): CESAREAN SECTION (N/A)  Patient Location: Mother/Baby  Anesthesia Type: Epidural  Level of Consciousness: awake and alert   Airway and Oxygen Therapy: Patient Spontanous Breathing  Post-op Pain: none  Post-op Assessment: Patient's Cardiovascular Status Stable, Respiratory Function Stable, Patent Airway, No signs of Nausea or vomiting, Adequate PO intake, Pain level controlled, No headache, No backache, No residual numbness and No residual motor weakness  Post-op Vital Signs: Reviewed and stable  Complications: No apparent anesthesia complications

## 2012-07-22 LAB — RH IG WORKUP (INCLUDES ABO/RH)
ABO/RH(D): A NEG
Fetal Screen: NEGATIVE
Gestational Age(Wks): 40

## 2012-07-22 MED ORDER — OXYCODONE-ACETAMINOPHEN 5-325 MG PO TABS: 1.0000 | ORAL_TABLET | Freq: Four times a day (QID) | ORAL | Status: AC | PRN

## 2012-07-22 MED ORDER — NORETHINDRONE 0.35 MG PO TABS: 1.0000 | ORAL_TABLET | Freq: Every day | ORAL | Status: DC

## 2012-07-22 MED ORDER — FERROUS SULFATE 325 (65 FE) MG PO TABS: 325.0000 mg | ORAL_TABLET | Freq: Two times a day (BID) | ORAL | Status: AC

## 2012-07-22 NOTE — Discharge Summary (Signed)
  Obstetric Discharge Summary Reason for Admission: induction of labor Prenatal Procedures: none Intrapartum Procedures: cesarean: low cervical, transverse Postpartum Procedures: none Complications-Operative and Postpartum: none  Hemoglobin  Date Value Range Status  07/21/2012 9.6* 12.0 - 15.0 g/dL Final     HCT  Date Value Range Status  07/21/2012 29.8* 36.0 - 46.0 % Final    Physical Exam:  General: alert Lochia: appropriate Uterine: firm Incision: clean, dry and intact DVT Evaluation: No evidence of DVT seen on physical exam.  Discharge Diagnoses: Active Problems:  Prolonged latent phase of labor  Cesarean delivery delivered   Discharge Information: Date: 07/22/2012 Activity: pelvic rest Diet: routine Medications:  Prior to Admission medications   Medication Sig Start Date End Date Taking? Authorizing Provider  albuterol (PROVENTIL HFA;VENTOLIN HFA) 108 (90 BASE) MCG/ACT inhaler Inhale 2 puffs into the lungs every 6 (six) hours as needed. SOB    Historical Provider, MD  ferrous sulfate 325 (65 FE) MG tablet Take 1 tablet (325 mg total) by mouth 2 (two) times daily before a meal. 07/22/12 07/22/13  Antionette Char, MD  norethindrone (ORTHO MICRONOR) 0.35 MG tablet Take 1 tablet (0.35 mg total) by mouth daily. 2nd Sunday start 07/22/12 07/22/13  Antionette Char, MD  oxyCODONE-acetaminophen (PERCOCET/ROXICET) 5-325 MG per tablet Take 1-2 tablets by mouth every 6 (six) hours as needed (moderate - severe pain). 07/22/12 08/01/12  Antionette Char, MD    Condition: stable Instructions: refer to routine discharge instructions Discharge to: home Follow-up Information    Follow up with HARPER,CHARLES A, MD. Schedule an appointment as soon as possible for a visit in 2 weeks.   Contact information:   [address] [place] [address] [phone]          Newborn Data: Live born  Information for the patient's newborn:  Rand, Boller Girl Bea  [[id number]]  female   Home with mother.  JACKSON-MOORE,Ewel Lona A 07/22/2012, 12:57 PM

## 2012-09-13 ENCOUNTER — Encounter (HOSPITAL_COMMUNITY): Payer: Self-pay | Admitting: *Deleted

## 2012-09-13 ENCOUNTER — Emergency Department (HOSPITAL_COMMUNITY)
Admission: EM | Admit: 2012-09-13 | Discharge: 2012-09-13 | Disposition: A | Discharge status: Home / Self Care | Payer: Medicaid Other | Source: Home / Self Care | Attending: Family Medicine | Admitting: Family Medicine

## 2012-09-13 DIAGNOSIS — M79609 Pain in unspecified limb: Secondary | ICD-10-CM

## 2012-09-13 DIAGNOSIS — M79606 Pain in leg, unspecified: Secondary | ICD-10-CM

## 2012-09-13 LAB — POCT URINALYSIS DIP (DEVICE)
Bilirubin Urine: NEGATIVE
Glucose, UA: NEGATIVE mg/dL
Ketones, ur: NEGATIVE mg/dL
Leukocytes, UA: NEGATIVE
Specific Gravity, Urine: 1.01 (ref 1.005–1.030)

## 2012-09-13 MED ORDER — HYDROCODONE-ACETAMINOPHEN 5-325 MG PO TABS: 2.0000 | ORAL_TABLET | ORAL | Status: DC | PRN

## 2012-09-13 NOTE — ED Notes (Signed)
Pt is here with complaints of 2 day history of "bone pain" (worse in her legs), dizziness and HA.  Denies cough, fever or urogenital symptoms.  Pt is 8 wks postpartum and is breast feeding.

## 2012-09-13 NOTE — ED Provider Notes (Signed)
History     CSN: [id number]  Arrival date & time 09/13/12  0906   First MD Initiated Contact with Patient 09/13/12 [phone]      Chief Complaint  Patient presents with  . Pain  . Dizziness  . Headache    (Consider location/radiation/quality/duration/timing/severity/associated sxs/prior treatment) Patient is a 26 y.o. female presenting with leg pain. The history is provided by the patient.  Leg Pain  The incident occurred more than 2 days ago. The incident occurred at home. There was no injury mechanism. The pain is present in the right thigh, left thigh, left leg and right leg. The pain is mild. Pertinent negatives include no numbness, no inability to bear weight, no loss of motion and no muscle weakness.    Past Medical History  Diagnosis Date  . Heart murmur   . Urinary tract infection   . Chlamydia ?2007    Past Surgical History  Procedure Date  . Cesarean section   . Laparoscopy for ectopic pregnancy Feb 2012  . Salpingectomy   . Cesarean section 07/20/2012    Procedure: CESAREAN SECTION;  Surgeon: Antionette Char, MD;  Location: [facility];  Service: Gynecology;  Laterality: N/A;    Family History  Problem Relation Age of Onset  . Anesthesia problems Neg Hx   . Hypotension Neg Hx   . Malignant hyperthermia Neg Hx   . Pseudochol deficiency Neg Hx   . Other Neg Hx     History  Substance Use Topics  . Smoking status: Former Smoker    Quit date: 12/06/2011  . Smokeless tobacco: Never Used  . Alcohol Use: No    OB History    Grav Para Term Preterm Abortions TAB SAB Ect Mult Living   3 2 1 1 1  0 0 1 1 3       Review of Systems  Constitutional: Negative.  Negative for fever.  Respiratory: Negative.   Cardiovascular: Negative for leg swelling.  Gastrointestinal: Negative.   Genitourinary: Negative.   Musculoskeletal: Positive for arthralgias.  Neurological: Positive for headaches. Negative for numbness.    Allergies  Review of patient's allergies  indicates no known allergies.  Home Medications   Current Outpatient Rx  Name Route Sig Dispense Refill  . ALBUTEROL SULFATE HFA 108 (90 BASE) MCG/ACT IN AERS Inhalation Inhale 2 puffs into the lungs every 6 (six) hours as needed. SOB    . FERROUS SULFATE 325 (65 FE) MG PO TABS Oral Take 1 tablet (325 mg total) by mouth 2 (two) times daily before a meal. 60 tablet 11  . HYDROCODONE-ACETAMINOPHEN 5-325 MG PO TABS Oral Take 2 tablets by mouth every 4 (four) hours as needed for pain. 15 tablet 0  . NORETHINDRONE 0.35 MG PO TABS Oral Take 1 tablet (0.35 mg total) by mouth daily. 2nd Sunday start 28 tablet 11    BP 125/79  Pulse 99  Temp 99.1 F (37.3 C) (Oral)  Resp 18  SpO2 98%  LMP 08/30/2012  Breastfeeding? Yes  Physical Exam  Nursing note and vitals reviewed. Constitutional: She is oriented to person, place, and time. She appears well-developed and well-nourished.  HENT:  Head: Normocephalic.  Right Ear: External ear normal.  Left Ear: External ear normal.  Mouth/Throat: Oropharynx is clear and moist.  Eyes: Conjunctivae normal are normal. Pupils are equal, round, and reactive to light.  Neck: Normal range of motion. Neck supple.  Cardiovascular: Normal rate, regular rhythm, normal heart sounds and intact distal pulses.   Pulmonary/Chest: Effort normal  and breath sounds normal.  Abdominal: Soft. Bowel sounds are normal. There is no tenderness.  Musculoskeletal: Normal range of motion. She exhibits no edema.  Lymphadenopathy:    She has no cervical adenopathy.  Neurological: She is alert and oriented to person, place, and time.  Skin: Skin is warm and dry.  Psychiatric: She has a normal mood and affect.    ED Course  Procedures (including critical care time)   Labs Reviewed  POCT URINALYSIS DIP (DEVICE)   No results found.   1. Lower extremity pain       MDM          Linna Hoff, MD 09/13/12 1043

## 2012-09-13 NOTE — ED Notes (Signed)
Pt discharged with Rx for Vicodin.  Pt is breast feeding, ok per Dr. Artis Flock.

## 2012-09-14 ENCOUNTER — Emergency Department (HOSPITAL_COMMUNITY)
Admission: EM | Admit: 2012-09-14 | Discharge: 2012-09-14 | Disposition: A | Discharge status: Home / Self Care | Payer: Medicaid Other | Source: Home / Self Care

## 2012-09-14 ENCOUNTER — Encounter (HOSPITAL_COMMUNITY): Payer: Self-pay

## 2012-09-14 DIAGNOSIS — B349 Viral infection, unspecified: Secondary | ICD-10-CM

## 2012-09-14 DIAGNOSIS — B9789 Other viral agents as the cause of diseases classified elsewhere: Secondary | ICD-10-CM

## 2012-09-14 NOTE — ED Provider Notes (Signed)
Medical screening examination/treatment/procedure(s) were performed by non-physician practitioner and as supervising physician I was immediately available for consultation/collaboration.  Novie Maggio   Ellard Nan, MD 09/14/12 1408 

## 2012-09-14 NOTE — ED Provider Notes (Addendum)
History     CSN: [id number]  Arrival date & time 09/14/12  1115   None     No chief complaint on file.   (Consider location/radiation/quality/duration/timing/severity/associated sxs/prior treatment) HPI Comments: 26 year old female was seen here yesterday for some leg pain and low-grade fever. He was treated with the Vicodin. The Vicodin cause some nausea but no vomiting. When she woke early this morning her symptoms were worse she now complains of generalized body aches and a frontal headache. She denies earache sore throat nasal congestion PND hearing problems neck pain back pain abdominal pain or urinary symptoms Her chief complaint is primarily body aches, fever, and malaise. She is currently breast-feeding and denies unusual breast swelling, discoloration or pain.   Past Medical History  Diagnosis Date  . Heart murmur   . Urinary tract infection   . Chlamydia ?2007    Past Surgical History  Procedure Date  . Cesarean section   . Laparoscopy for ectopic pregnancy Feb 2012  . Salpingectomy   . Cesarean section 07/20/2012    Procedure: CESAREAN SECTION;  Surgeon: Antionette Char, MD;  Location: [facility];  Service: Gynecology;  Laterality: N/A;    Family History  Problem Relation Age of Onset  . Anesthesia problems Neg Hx   . Hypotension Neg Hx   . Malignant hyperthermia Neg Hx   . Pseudochol deficiency Neg Hx   . Other Neg Hx     History  Substance Use Topics  . Smoking status: Former Smoker    Quit date: 12/06/2011  . Smokeless tobacco: Never Used  . Alcohol Use: No    OB History    Grav Para Term Preterm Abortions TAB SAB Ect Mult Living   3 2 1 1 1  0 0 1 1 3       Review of Systems  Constitutional: Positive for fever and fatigue. Negative for chills, activity change and appetite change.  HENT: Negative for congestion, sore throat, facial swelling, rhinorrhea, neck pain, neck stiffness and postnasal drip.   Eyes: Negative.   Respiratory: Negative.   Negative for cough, shortness of breath and wheezing.   Cardiovascular: Negative.  Negative for chest pain.  Gastrointestinal: Positive for nausea. Negative for vomiting and abdominal pain.  Musculoskeletal: Positive for myalgias. Negative for gait problem.  Skin: Negative for pallor and rash.  Neurological: Negative.     Allergies  Review of patient's allergies indicates no known allergies.  Home Medications   Current Outpatient Rx  Name Route Sig Dispense Refill  . ALBUTEROL SULFATE HFA 108 (90 BASE) MCG/ACT IN AERS Inhalation Inhale 2 puffs into the lungs every 6 (six) hours as needed. SOB    . FERROUS SULFATE 325 (65 FE) MG PO TABS Oral Take 1 tablet (325 mg total) by mouth 2 (two) times daily before a meal. 60 tablet 11  . HYDROCODONE-ACETAMINOPHEN 5-325 MG PO TABS Oral Take 2 tablets by mouth every 4 (four) hours as needed for pain. 15 tablet 0  . NORETHINDRONE 0.35 MG PO TABS Oral Take 1 tablet (0.35 mg total) by mouth daily. 2nd Sunday start 28 tablet 11    BP 132/82  Pulse 92  Temp 99.8 F (37.7 C) (Oral)  Resp 16  SpO2 100%  LMP 08/30/2012  Breastfeeding? Yes  Physical Exam  Constitutional: She is oriented to person, place, and time. She appears well-developed and well-nourished. No distress.  HENT:  Head: Normocephalic and atraumatic.  Mouth/Throat: Oropharynx is clear and moist. No oropharyngeal exudate.  Eyes: EOM are  normal. Pupils are equal, round, and reactive to light.  Neck: Normal range of motion. Neck supple.  Cardiovascular: Normal rate and normal heart sounds.   Pulmonary/Chest: Effort normal and breath sounds normal. No respiratory distress. She has no wheezes. She has no rales.  Abdominal: Soft. There is no tenderness.  Musculoskeletal: Normal range of motion. She exhibits no edema and no tenderness.  Neurological: She is alert and oriented to person, place, and time. No cranial nerve deficit.  Skin: Skin is warm and dry. No erythema.  Psychiatric:  She has a normal mood and affect.    ED Course  Procedures (including critical care time)  Labs Reviewed - No data to display No results found.   1. Viral syndrome       MDM  Reassurance.. instructions on viral syndrome. I am unable to locate particular source for her fever. A combination of fever, forehead headache and body aches this is likely representative of the viral syndrome that is epidemiologicaly  present in the community. We discussed symptoms of superinfection and how to noticice  focal sources of infection. Should these develop she should return for treatment. She is also planning to use prior frozen breast milk instead of current lactating milk until her symptoms abate.        Hayden Rasmussen, NP 09/14/12 1349  Hayden Rasmussen, NP 09/14/12 1402  Hayden Rasmussen, NP 09/14/12 1405  Hayden Rasmussen, NP 09/14/12 1410

## 2012-09-14 NOTE — ED Notes (Signed)
Patient c/o cough, HA, congestion ; NAD

## 2012-09-14 NOTE — ED Provider Notes (Signed)
Medical screening examination/treatment/procedure(s) were performed by non-physician practitioner and as supervising physician I was immediately available for consultation/collaboration.  Raynald Blend, MD 09/14/12 [phone]

## 2012-12-07 IMAGING — US US OB TRANSVAGINAL
1 series · 13 of 28 positions shown · non-contrast
Comparison: none

[Series 1: us ob comp less 14 wks · 13 of 40 slices shown]
[im 2/40]
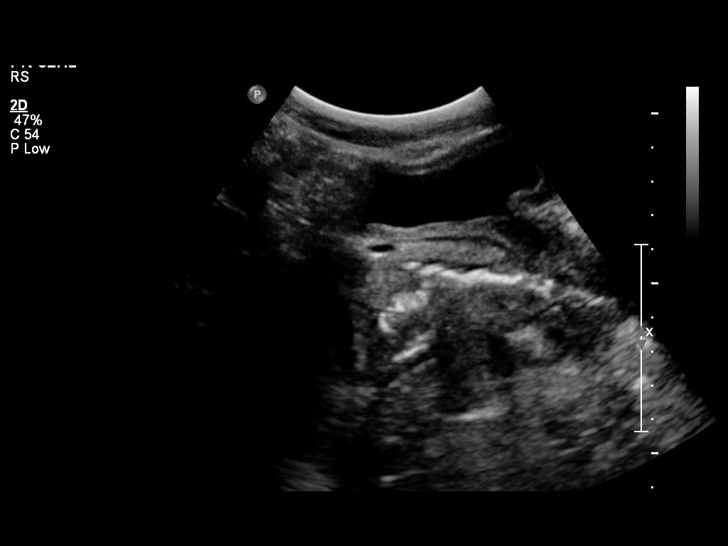
[im 5/40]
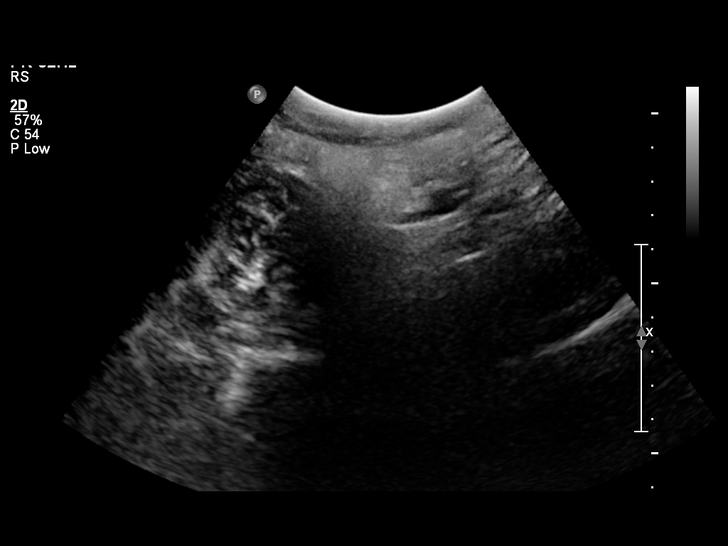
[im 8/40]
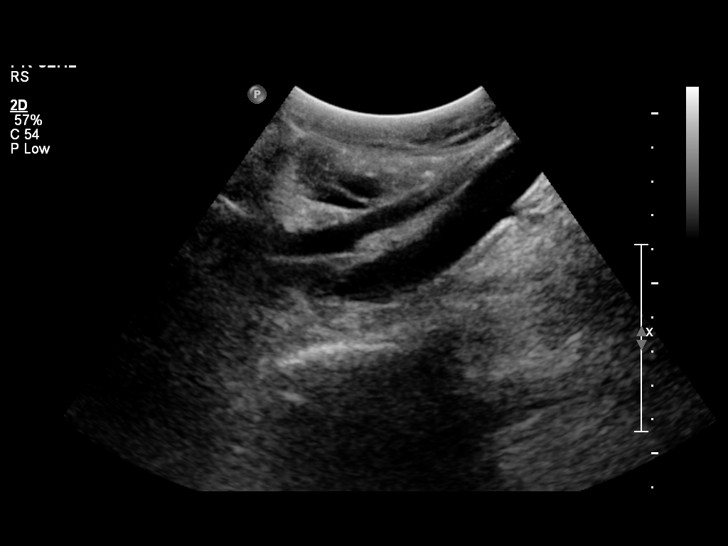
[im 11/40]
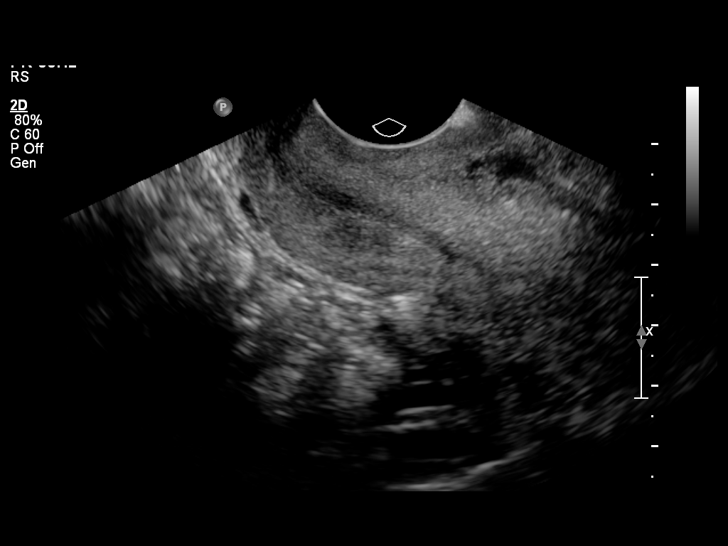
[im 14/40]
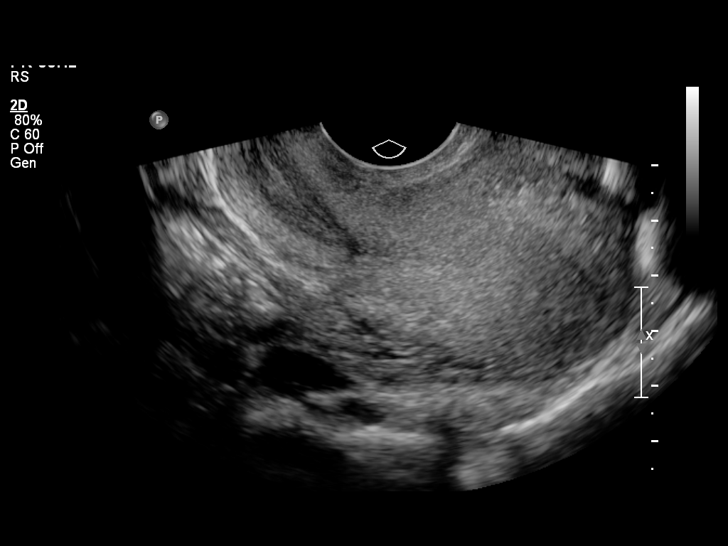
[im 16/40]
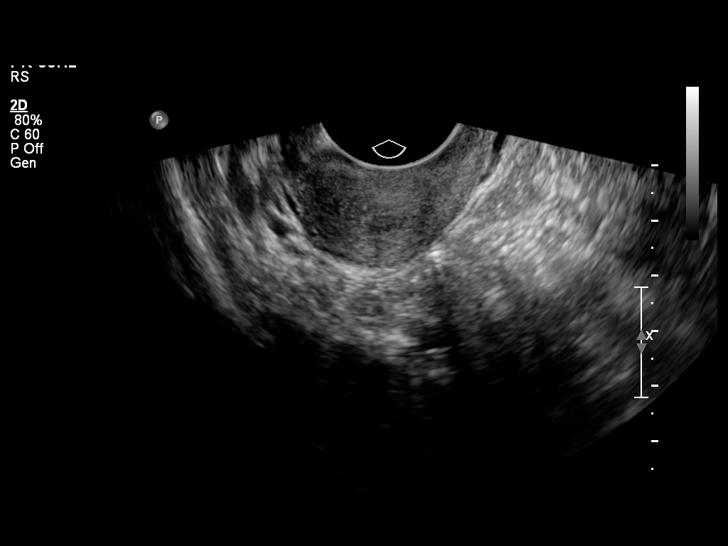
[im 21/40]
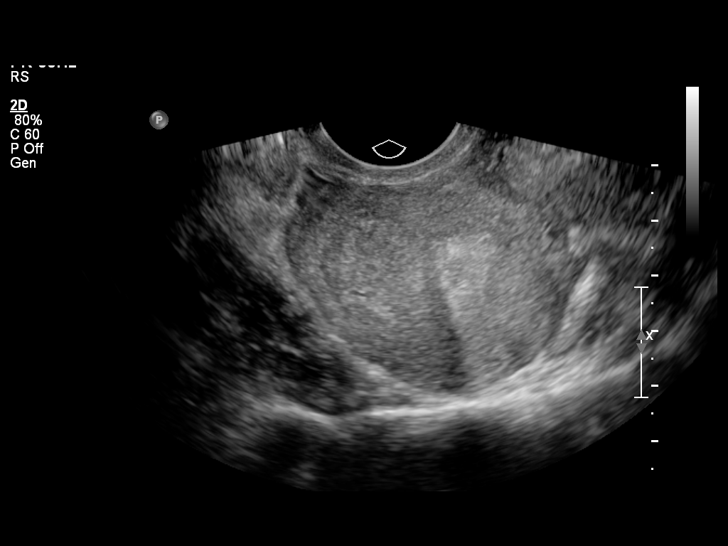
[im 24/40]
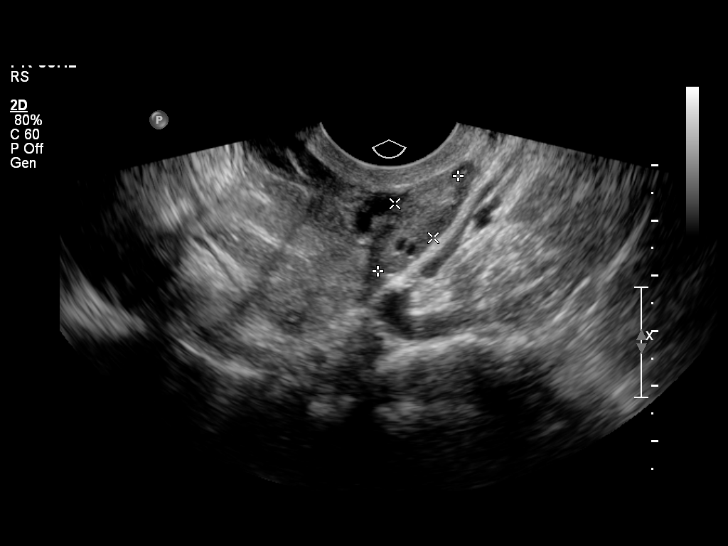
[im 27/40]
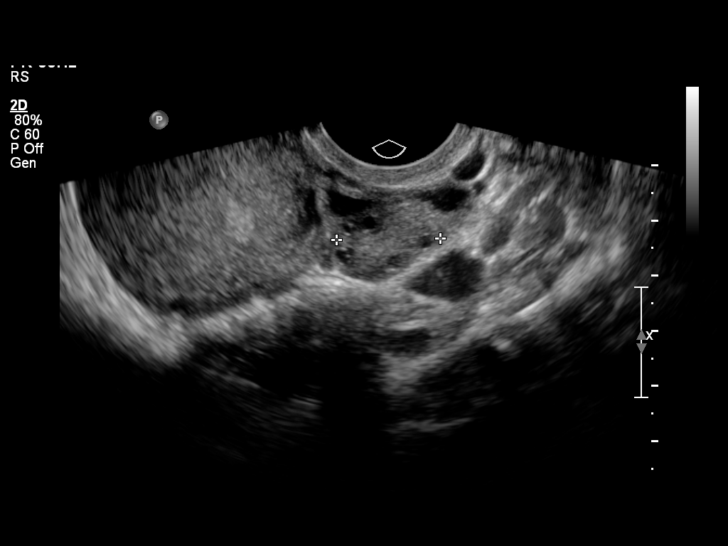
[im 29/40]
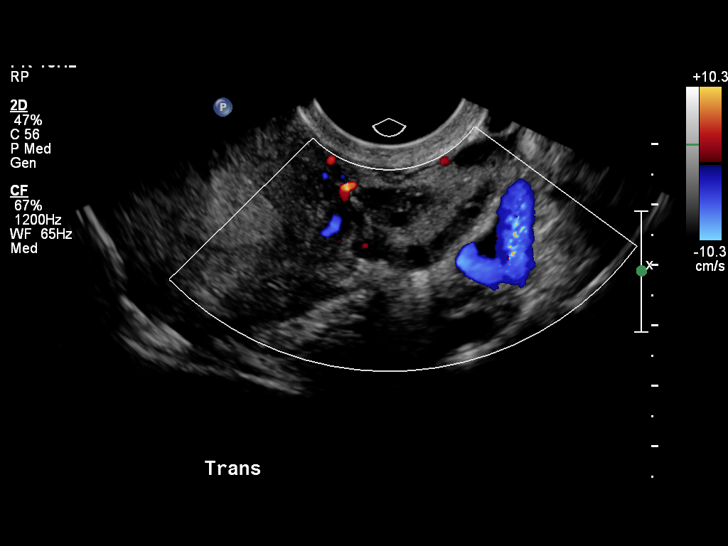
[im 32/40]
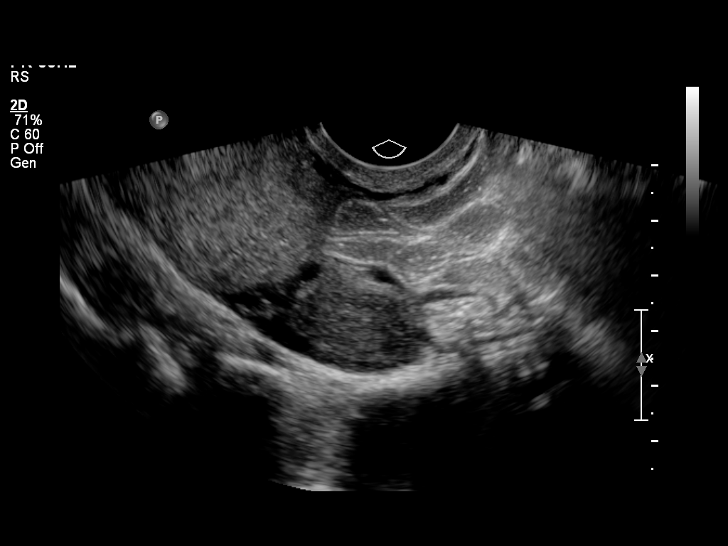
[im 35/40]
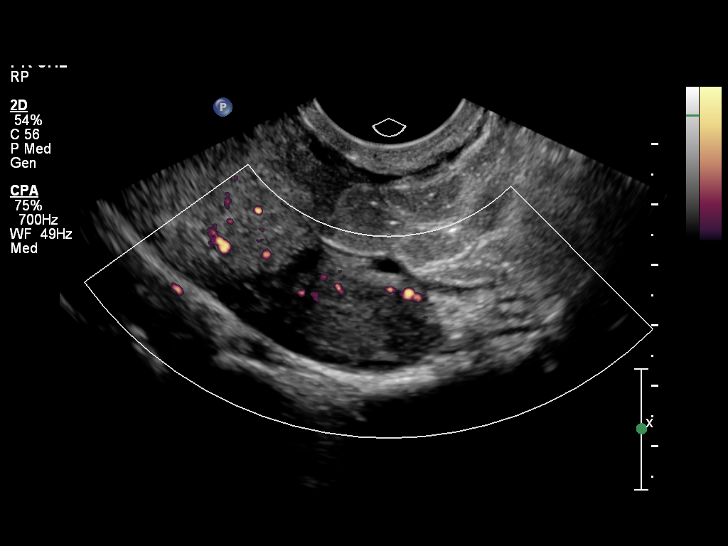
[im 38/40]
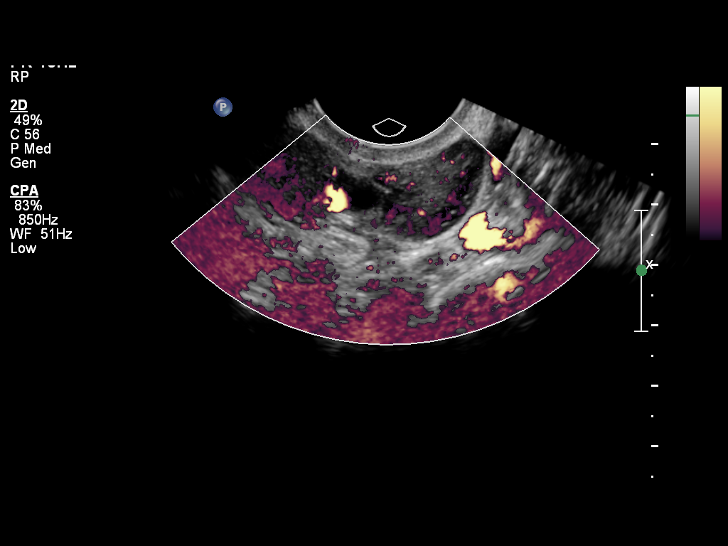

[13 of 28 positions shown; findings below may reference images not displayed]

OBSTETRICS REPORT
                      (Signed Final 11/06/2011 [DATE])

                 05_E
Procedures

 US OB COMP LESS 14 WKS                                76801.0
 US OB TRANSVAGINAL                                    76817.0
Indications

 Poor obstetrical history (hx rt ectopic)
 Pain - Abdominal/Pelvic
 Rt Salpingectomy
Fetal Evaluation

 Gest. Sac:         None seen
 Yolk Sac:          Not visualized
 Fetal Pole:        Not visualized
 Cardiac Activity:  No embryo visualized
Cervix Uterus Adnexa

 Cervix:       Closed.
 Uterus:       Retroverted. Slightly thickened endometrial lining
               measuring 1.1 cm in depth
 Cul De Sac:   No free fluid seen.

 Left Ovary:   Within normal limits measuring 1.9 x 2.3 x .9 cm.
 Right Ovary:  Within normal limits measuring 2.1 x 3.0 x 1.7 cm.
               Small corpus luteum noted.
 Adnexa:     No abnormality visualized.
Impression

 Slightly thickened endometrium with no signs of an
 intrauterine gestational sac seen. Normal ovaries with right
 sided corpus luteum and no pelvic fluid or adnexal masses.
 Today's findings could represent an IUP too early to visualize,
 a sonographically silent ectopic gestation or abnormal
 nonprogressing gestation. Given the history of prior right
 salpingectomy and right corpus luteum today, continued
 close follow up with serial bHCG is recommended with follow
 up sonography as indicated by these results.

## 2012-12-11 IMAGING — US US OB TRANSVAGINAL
1 series · 13 of 28 positions shown · non-contrast
Comparison: none

[Series 1: us ob transvaginal · 46 acquisitions, 13 frames shown]
[im 2/46]
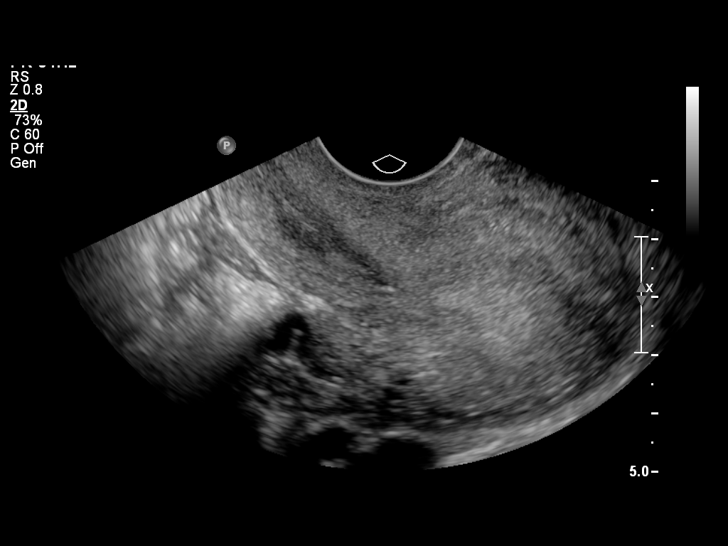
[im 6/46]
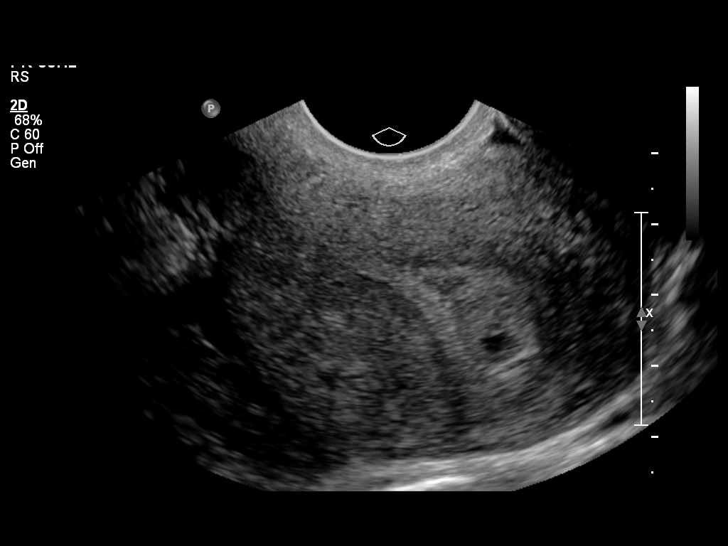
[im 9/46]
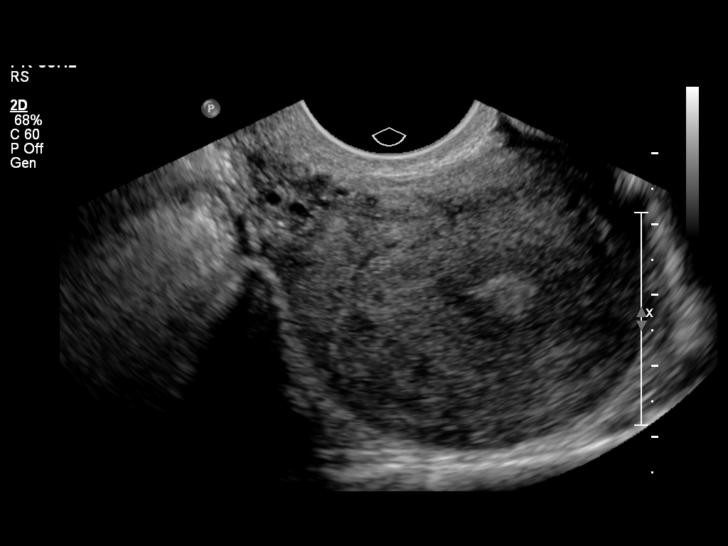
[im 12/46]
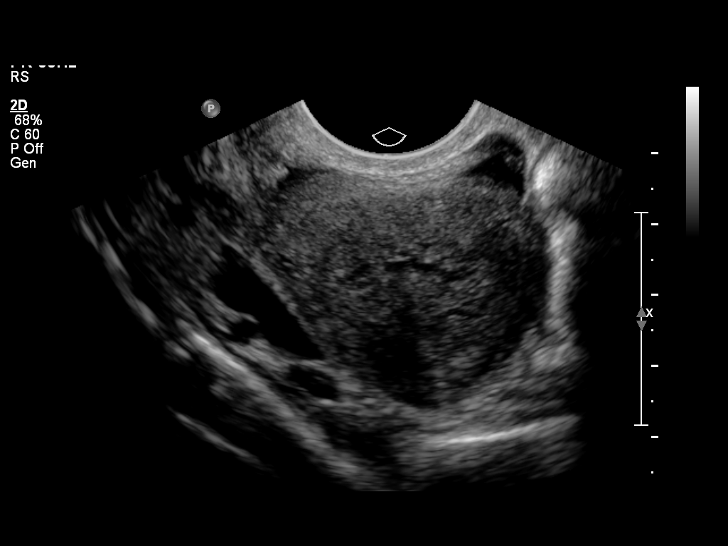
[im 16/46]
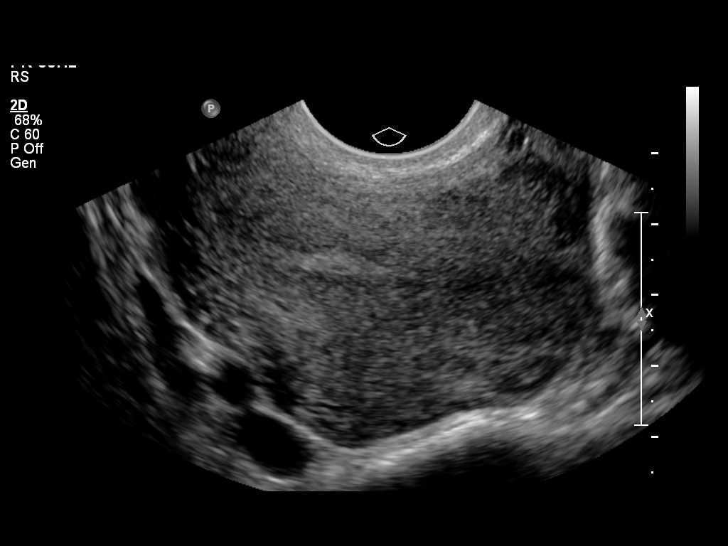
[im 19/46]
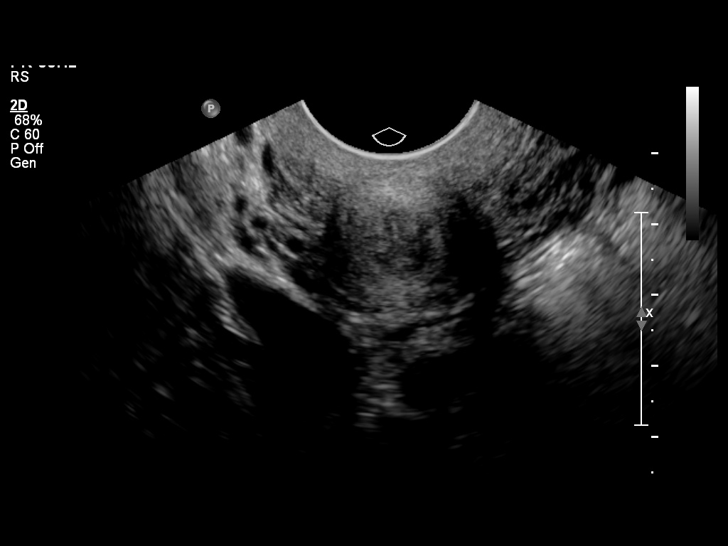
[im 24/46]
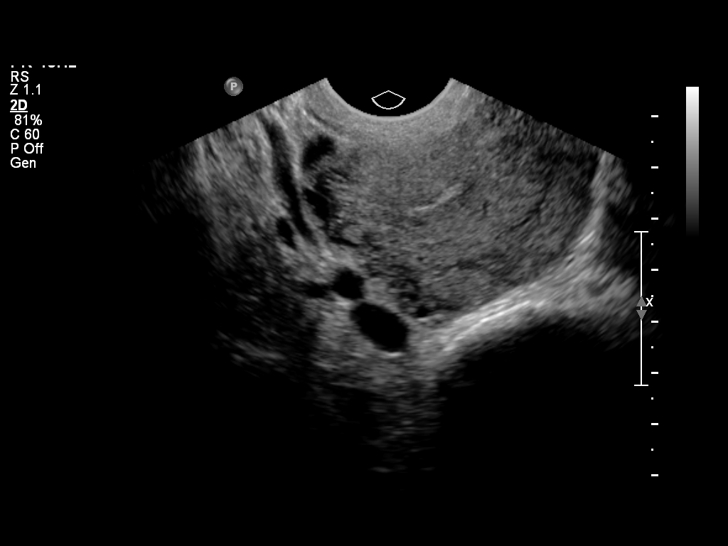
[im 27/46]
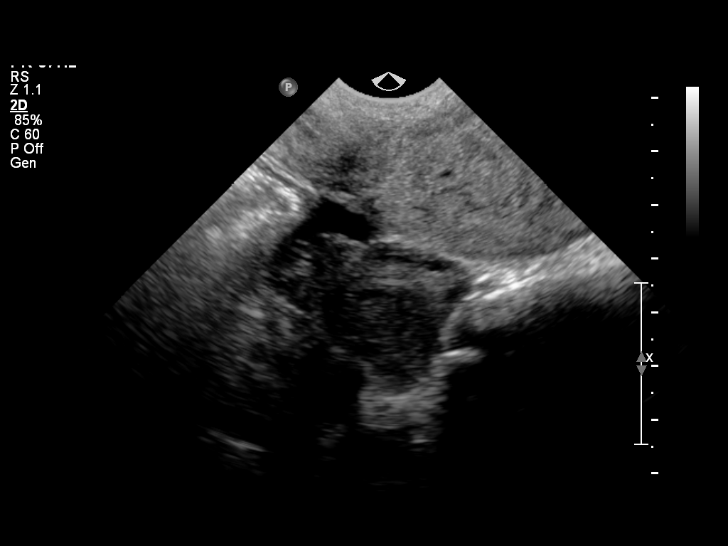
[im 31/46]
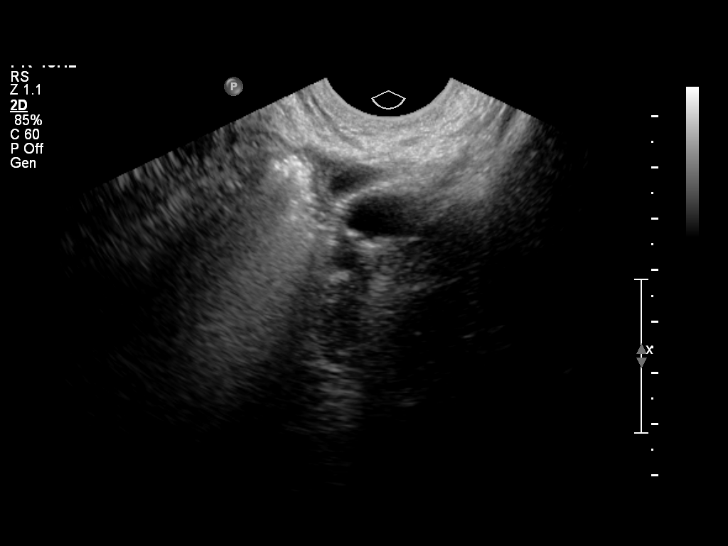
[im 34/46]
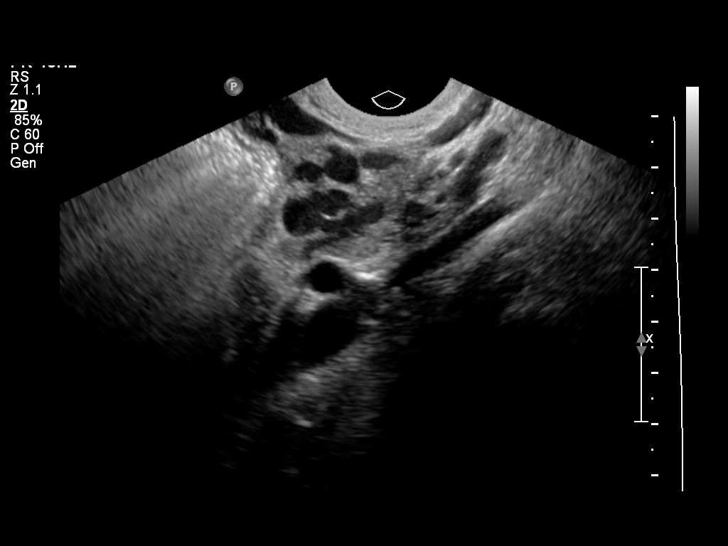
[im 37/46]
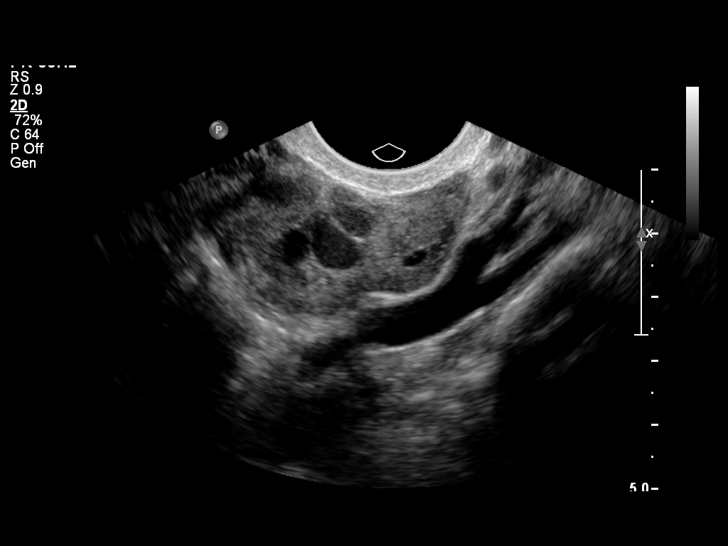
[im 41/46]
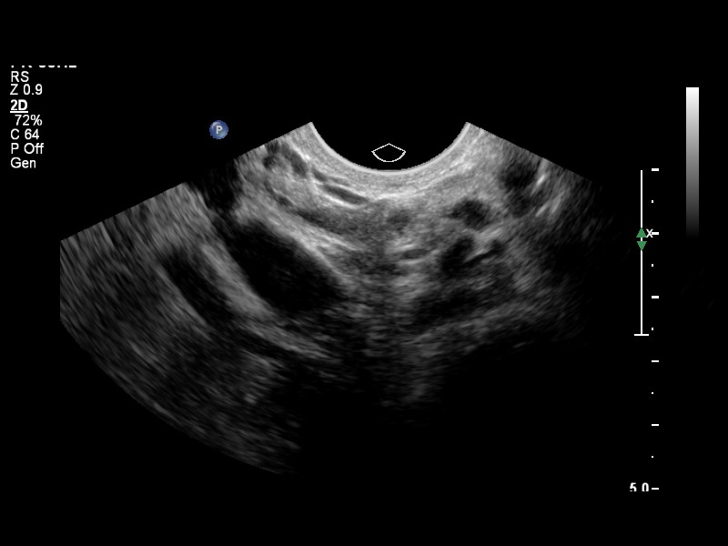
[im 44/46]
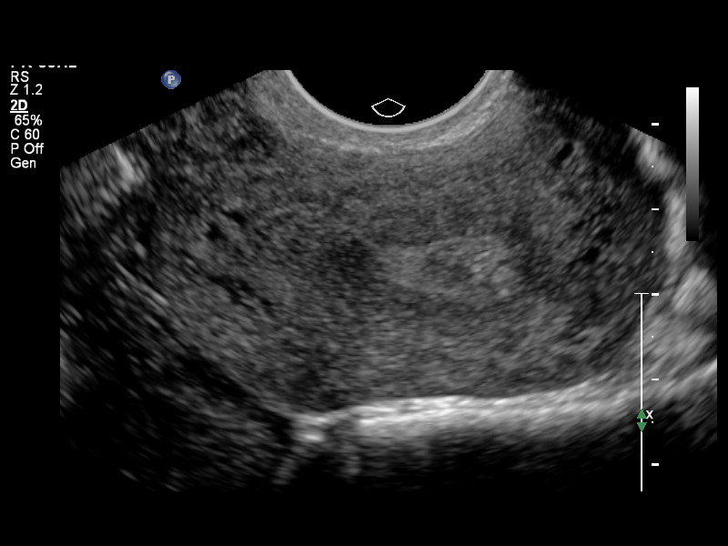

[13 of 28 positions shown; findings below may reference images not displayed]

OBSTETRICS REPORT
                      (Signed Final 11/10/2011 [DATE])

Procedures

 US OB TRANSVAGINAL                                    76817.0
Indications

 Poor obstetrical history (History of Ectopic)
Fetal Evaluation

 Gest. Sac:         None seen
 Yolk Sac:          Not visualized
 Fetal Pole:        Not visualized
 Cardiac Activity:  Not visualized

 Comment:    Quesstion 4-5mm intrauterine GS.
Cervix Uterus Adnexa

 Cervix:       Closed. Normal appearance by transvaginal scan
 Uterus:       No abnormality visualized. Retroverted.
 Cul De Sac:   No free fluid seen.
 Left Ovary:   Within normal limits.
 Right Ovary:  Within normal limits. Small corpus luteum noted.
 Adnexa:     No abnormality visualized.
Comments

 Patients Quants:
 11-06-11     [phone]
 11-08-11     [phone]
 11-10-11     [phone].
Impression

  There is no definitive  evidence of intrauterine or ectopic
 pregnancy at this time.  Differential diagnosis includes early,
 radiographically occult pregnancy (intrauterine or ectopic) vs.
 spontaneous abortion.   Please continue to follow with serial
 Bhcg levels and ultrasound, as clinically indicated. I suspect
 that the tiny intrauterine fluid collection is an early IUP.

## 2014-09-24 ENCOUNTER — Encounter (HOSPITAL_COMMUNITY): Payer: Self-pay

## 2015-12-12 ENCOUNTER — Encounter: Payer: Medicaid Other | Admitting: Obstetrics & Gynecology

## 2016-04-20 DIAGNOSIS — H6692 Otitis media, unspecified, left ear: Secondary | ICD-10-CM | POA: Diagnosis not present

## 2016-04-25 DIAGNOSIS — H5213 Myopia, bilateral: Secondary | ICD-10-CM | POA: Diagnosis not present

## 2016-04-25 DIAGNOSIS — H6692 Otitis media, unspecified, left ear: Secondary | ICD-10-CM | POA: Diagnosis not present

## 2016-07-14 DIAGNOSIS — R0789 Other chest pain: Secondary | ICD-10-CM | POA: Diagnosis not present

## 2016-07-14 DIAGNOSIS — R42 Dizziness and giddiness: Secondary | ICD-10-CM | POA: Diagnosis not present

## 2016-11-17 DIAGNOSIS — Z113 Encounter for screening for infections with a predominantly sexual mode of transmission: Secondary | ICD-10-CM | POA: Diagnosis not present

## 2016-11-17 DIAGNOSIS — Z1151 Encounter for screening for human papillomavirus (HPV): Secondary | ICD-10-CM | POA: Diagnosis not present

## 2016-11-17 DIAGNOSIS — Z6821 Body mass index (BMI) 21.0-21.9, adult: Secondary | ICD-10-CM | POA: Diagnosis not present

## 2016-11-17 DIAGNOSIS — Z01419 Encounter for gynecological examination (general) (routine) without abnormal findings: Secondary | ICD-10-CM | POA: Diagnosis not present

## 2016-11-17 DIAGNOSIS — R5383 Other fatigue: Secondary | ICD-10-CM | POA: Diagnosis not present

## 2017-02-07 ENCOUNTER — Encounter (HOSPITAL_COMMUNITY): Payer: Self-pay | Admitting: *Deleted

## 2017-02-07 ENCOUNTER — Inpatient Hospital Stay (HOSPITAL_COMMUNITY): Payer: BLUE CROSS/BLUE SHIELD

## 2017-02-07 ENCOUNTER — Inpatient Hospital Stay (HOSPITAL_COMMUNITY)
Admission: AD | Admit: 2017-02-07 | Discharge: 2017-02-07 | Disposition: A | Discharge status: Home / Self Care | Payer: BLUE CROSS/BLUE SHIELD | Source: Ambulatory Visit | Attending: Obstetrics and Gynecology | Admitting: Obstetrics and Gynecology

## 2017-02-07 DIAGNOSIS — Z3201 Encounter for pregnancy test, result positive: Secondary | ICD-10-CM | POA: Diagnosis not present

## 2017-02-07 DIAGNOSIS — O0911 Supervision of pregnancy with history of ectopic or molar pregnancy, first trimester: Secondary | ICD-10-CM | POA: Diagnosis not present

## 2017-02-07 DIAGNOSIS — Z3A08 8 weeks gestation of pregnancy: Secondary | ICD-10-CM | POA: Diagnosis not present

## 2017-02-07 DIAGNOSIS — Z8759 Personal history of other complications of pregnancy, childbirth and the puerperium: Secondary | ICD-10-CM

## 2017-02-07 DIAGNOSIS — R109 Unspecified abdominal pain: Secondary | ICD-10-CM

## 2017-02-07 DIAGNOSIS — Z3A01 Less than 8 weeks gestation of pregnancy: Secondary | ICD-10-CM | POA: Diagnosis not present

## 2017-02-07 DIAGNOSIS — R1084 Generalized abdominal pain: Secondary | ICD-10-CM | POA: Diagnosis not present

## 2017-02-07 DIAGNOSIS — O26891 Other specified pregnancy related conditions, first trimester: Secondary | ICD-10-CM | POA: Diagnosis not present

## 2017-02-07 LAB — URINALYSIS, ROUTINE W REFLEX MICROSCOPIC
BILIRUBIN URINE: NEGATIVE
Glucose, UA: NEGATIVE mg/dL
Hgb urine dipstick: NEGATIVE
Ketones, ur: NEGATIVE mg/dL
LEUKOCYTES UA: NEGATIVE
NITRITE: NEGATIVE
PH: 6 (ref 5.0–8.0)
Protein, ur: NEGATIVE mg/dL
SPECIFIC GRAVITY, URINE: 1.008 (ref 1.005–1.030)

## 2017-02-07 LAB — HCG, QUANTITATIVE, PREGNANCY: HCG, BETA CHAIN, QUANT, S: 1201 m[IU]/mL — AB (ref <5)

## 2017-02-07 LAB — POCT PREGNANCY, URINE: PREG TEST UR: POSITIVE — AB

## 2017-02-07 NOTE — MAU Note (Signed)
+  HPT yesterday, has been cramping really bad. Hx of ectopic preg, has her on edge, wants to make sure she is not going through that again.

## 2017-02-07 NOTE — Discharge Instructions (Signed)
First Trimester of Pregnancy The first trimester of pregnancy is from week 1 until the end of week 13 (months 1 through 3). A week after a sperm fertilizes an egg, the egg will implant on the wall of the uterus. This embryo will begin to develop into a baby. Genes from you and your partner will form the baby. The female genes will determine whether the baby will be a boy or a girl. At 6-8 weeks, the eyes and face will be formed, and the heartbeat can be seen on ultrasound. At the end of 12 weeks, all the baby's organs will be formed. Now that you are pregnant, you will want to do everything you can to have a healthy baby. Two of the most important things are to get good prenatal care and to follow your health care provider's instructions. Prenatal care is all the medical care you receive before the baby's birth. This care will help prevent, find, and treat any problems during the pregnancy and childbirth. Body changes during your first trimester Your body goes through many changes during pregnancy. The changes vary from woman to woman.  You may gain or lose a couple of pounds at first.  You may feel sick to your stomach (nauseous) and you may throw up (vomit). If the vomiting is uncontrollable, call your health care provider.  You may tire easily.  You may develop headaches that can be relieved by medicines. All medicines should be approved by your health care provider.  You may urinate more often. Painful urination may mean you have a bladder infection.  You may develop heartburn as a result of your pregnancy.  You may develop constipation because certain hormones are causing the muscles that push stool through your intestines to slow down.  You may develop hemorrhoids or swollen veins (varicose veins).  Your breasts may begin to grow larger and become tender. Your nipples may stick out more, and the tissue that surrounds them (areola) may become darker.  Your gums may bleed and may be  sensitive to brushing and flossing.  Dark spots or blotches (chloasma, mask of pregnancy) may develop on your face. This will likely fade after the baby is born.  Your menstrual periods will stop.  You may have a loss of appetite.  You may develop cravings for certain kinds of food.  You may have changes in your emotions from day to day, such as being excited to be pregnant or being concerned that something may go wrong with the pregnancy and baby.  You may have more vivid and strange dreams.  You may have changes in your hair. These can include thickening of your hair, rapid growth, and changes in texture. Some women also have hair loss during or after pregnancy, or hair that feels dry or thin. Your hair will most likely return to normal after your baby is born.  What to expect at prenatal visits During a routine prenatal visit:  You will be weighed to make sure you and the baby are growing normally.  Your blood pressure will be taken.  Your abdomen will be measured to track your baby's growth.  The fetal heartbeat will be listened to between weeks 10 and 14 of your pregnancy.  Test results from any previous visits will be discussed.  Your health care provider may ask you:  How you are feeling.  If you are feeling the baby move.  If you have had any abnormal symptoms, such as leaking fluid, bleeding, severe headaches,   or abdominal cramping.  If you are using any tobacco products, including cigarettes, chewing tobacco, and electronic cigarettes.  If you have any questions.  Other tests that may be performed during your first trimester include:  Blood tests to find your blood type and to check for the presence of any previous infections. The tests will also be used to check for low iron levels (anemia) and protein on red blood cells (Rh antibodies). Depending on your risk factors, or if you previously had diabetes during pregnancy, you may have tests to check for high blood  sugar that affects pregnant women (gestational diabetes).  Urine tests to check for infections, diabetes, or protein in the urine.  An ultrasound to confirm the proper growth and development of the baby.  Fetal screens for spinal cord problems (spina bifida) and Down syndrome.  HIV (human immunodeficiency virus) testing. Routine prenatal testing includes screening for HIV, unless you choose not to have this test.  You may need other tests to make sure you and the baby are doing well.  Follow these instructions at home: Medicines  Follow your health care provider's instructions regarding medicine use. Specific medicines may be either safe or unsafe to take during pregnancy.  Take a prenatal vitamin that contains at least 600 micrograms (mcg) of folic acid.  If you develop constipation, try taking a stool softener if your health care provider approves. Eating and drinking  Eat a balanced diet that includes fresh fruits and vegetables, whole grains, good sources of protein such as meat, eggs, or tofu, and low-fat dairy. Your health care provider will help you determine the amount of weight gain that is right for you.  Avoid raw meat and uncooked cheese. These carry germs that can cause birth defects in the baby.  Eating four or five small meals rather than three large meals a day may help relieve nausea and vomiting. If you start to feel nauseous, eating a few soda crackers can be helpful. Drinking liquids between meals, instead of during meals, also seems to help ease nausea and vomiting.  Limit foods that are high in fat and processed sugars, such as fried and sweet foods.  To prevent constipation: ? Eat foods that are high in fiber, such as fresh fruits and vegetables, whole grains, and beans. ? Drink enough fluid to keep your urine clear or pale yellow. Activity  Exercise only as directed by your health care provider. Most women can continue their usual exercise routine during  pregnancy. Try to exercise for 30 minutes at least 5 days a week. Exercising will help you: ? Control your weight. ? Stay in shape. ? Be prepared for labor and delivery.  Experiencing pain or cramping in the lower abdomen or lower back is a good sign that you should stop exercising. Check with your health care provider before continuing with normal exercises.  Try to avoid standing for long periods of time. Move your legs often if you must stand in one place for a long time.  Avoid heavy lifting.  Wear low-heeled shoes and practice good posture.  You may continue to have sex unless your health care provider tells you not to. Relieving pain and discomfort  Wear a good support bra to relieve breast tenderness.  Take warm sitz baths to soothe any pain or discomfort caused by hemorrhoids. Use hemorrhoid cream if your health care provider approves.  Rest with your legs elevated if you have leg cramps or low back pain.  If you develop   varicose veins in your legs, wear support hose. Elevate your feet for 15 minutes, 3-4 times a day. Limit salt in your diet. Prenatal care  Schedule your prenatal visits by the twelfth week of pregnancy. They are usually scheduled monthly at first, then more often in the last 2 months before delivery.  Write down your questions. Take them to your prenatal visits.  Keep all your prenatal visits as told by your health care provider. This is important. Safety  Wear your seat belt at all times when driving.  Make a list of emergency phone numbers, including numbers for family, friends, the hospital, and police and fire departments. General instructions  Ask your health care provider for a referral to a local prenatal education class. Begin classes no later than the beginning of month 6 of your pregnancy.  Ask for help if you have counseling or nutritional needs during pregnancy. Your health care provider can offer advice or refer you to specialists for help  with various needs.  Do not use hot tubs, steam rooms, or saunas.  Do not douche or use tampons or scented sanitary pads.  Do not cross your legs for long periods of time.  Avoid cat litter boxes and soil used by cats. These carry germs that can cause birth defects in the baby and possibly loss of the fetus by miscarriage or stillbirth.  Avoid all smoking, herbs, alcohol, and medicines not prescribed by your health care provider. Chemicals in these products affect the formation and growth of the baby.  Do not use any products that contain nicotine or tobacco, such as cigarettes and e-cigarettes. If you need help quitting, ask your health care provider. You may receive counseling support and other resources to help you quit.  Schedule a dentist appointment. At home, brush your teeth with a soft toothbrush and be gentle when you floss. Contact a health care provider if:  You have dizziness.  You have mild pelvic cramps, pelvic pressure, or nagging pain in the abdominal area.  You have persistent nausea, vomiting, or diarrhea.  You have a bad smelling vaginal discharge.  You have pain when you urinate.  You notice increased swelling in your face, hands, legs, or ankles.  You are exposed to fifth disease or chickenpox.  You are exposed to German measles (rubella) and have never had it. Get help right away if:  You have a fever.  You are leaking fluid from your vagina.  You have spotting or bleeding from your vagina.  You have severe abdominal cramping or pain.  You have rapid weight gain or loss.  You vomit blood or material that looks like coffee grounds.  You develop a severe headache.  You have shortness of breath.  You have any kind of trauma, such as from a fall or a car accident. Summary  The first trimester of pregnancy is from week 1 until the end of week 13 (months 1 through 3).  Your body goes through many changes during pregnancy. The changes vary from  woman to woman.  You will have routine prenatal visits. During those visits, your health care provider will examine you, discuss any test results you may have, and talk with you about how you are feeling. This information is not intended to replace advice given to you by your health care provider. Make sure you discuss any questions you have with your health care provider. Document Released: 11/03/2001 Document Revised: 10/21/2016 Document Reviewed: 10/21/2016 Elsevier Interactive Patient Education  2017 [employer]  

## 2017-02-07 NOTE — [facility] Provider Note (Signed)
History     Chief Complaint  Patient presents with  . Possible Pregnancy  . Abdominal Pain   31 yo [id number] MBF  LMP 2/15 hx ectopic pregnancy  30 yrs ago with unilateral salpingectomy presents with c/o (+) urine preg test. LMP . Pt denies vaginal bleeding or pain. No contraception. C/S x 2  OB History    Gravida Para Term Preterm AB Living   5 2 1 1 1 3    SAB TAB Ectopic Multiple Live Births   0 0 1 1 2       Past Medical History:  Diagnosis Date  . Chlamydia ?2007  . Heart murmur   . Urinary tract infection     Past Surgical History:  Procedure Laterality Date  . CESAREAN SECTION    . CESAREAN SECTION  07/20/2012   Procedure: CESAREAN SECTION;  Surgeon: Antionette CharLisa Jackson-Moore, MD;  Location: [facility];  Service: Gynecology;  Laterality: N/A;  . LAPAROSCOPY FOR ECTOPIC PREGNANCY  Feb 2012  . SALPINGECTOMY      Family History  Problem Relation Age of Onset  . Anesthesia problems Neg Hx   . Hypotension Neg Hx   . Malignant hyperthermia Neg Hx   . Pseudochol deficiency Neg Hx   . Other Neg Hx     Social History  Substance Use Topics  . Smoking status: Former Smoker    Quit date: 12/06/2011  . Smokeless tobacco: Never Used  . Alcohol use No    Allergies: No Known Allergies  Prescriptions Prior to Admission  Medication Sig Dispense Refill Last Dose  . albuterol (PROVENTIL HFA;VENTOLIN HFA) 108 (90 BASE) MCG/ACT inhaler Inhale 2 puffs into the lungs every 6 (six) hours as needed. SOB   Unknown at Unknown  . HYDROcodone-acetaminophen (NORCO/VICODIN) 5-325 MG per tablet Take 2 tablets by mouth every 4 (four) hours as needed for pain. 15 tablet 0 2013  . norethindrone (ORTHO MICRONOR) 0.35 MG tablet Take 1 tablet (0.35 mg total) by mouth daily. 2nd Sunday start 28 tablet 11 Unknown at Unknown     Physical Exam   Blood pressure (!) 107/46, pulse 92, temperature 98.6 F (37 C), temperature source Oral, resp. rate 16, weight 55.2 kg (121 lb 12 oz), last menstrual period  01/07/2017, SpO2 100 %, currently breastfeeding.  General appearance: alert, cooperative and no distress Lungs: clear to auscultation bilaterally Heart: regular rate and rhythm Abdomen: soft, non-tender; bowel sounds normal; no masses,  no organomegaly and pfannensteil incision Pelvic: cervix normal in appearance, external genitalia normal, no adnexal masses or tenderness, uterus normal size, shape, and consistency and vagina normal without discharge, RV uterus  Extremities: no edema, redness or tenderness in the calves or thighs ED Course  Hx ectopic pregnancy Pregnancy test positive, unconfirmed P) hquant. If elevated enough, pelvic sonogram MDM  addendum  hquant 12[address] [place] Ob Comp Less 14 Wks  Result Date: 02/07/2017 CLINICAL DATA:  Abdominal pain. EXAM: OBSTETRIC <14 WK US AND TRANSVAGINAL OB US TECHNIQUE: Both transabdominal and transvaginal ultrasound examinations were performed for complete evaluation of the gestation as well as the maternal uterus, adnexal regions, and pelvic cul-de-sac. Transvaginal technique was performed to assess early pregnancy. COMPARISON:  11/18/2011 FINDINGS: Intrauterine gestational sac: Possible early gestational sac in the fundal endometrium Yolk sac:  Not visualized Embryo:  Not visualized MSD: 2.3  mm   4 w   6  d Subchorionic hemorrhage:  None visualized. Maternal uterus/adnexae: Retroverted uterus. Unremarkable appearance of the ovaries. IMPRESSION: Possible early intrauterine gestational sac, but  no yolk sac, fetal pole, or cardiac activity yet visualized. Recommend follow-up quantitative B-HCG levels and follow-up US in 14 days to assess viability. This recommendation follows SRU consensus guidelines: Diagnostic Criteria for Nonviable Pregnancy Early in the First Trimester. Malva Limes Med 2013; 409:8119-14. Electronically Signed   By: Sebastian Ache M.D.   On: 02/07/2017 16:51   US Ob Transvaginal  Result Date: 02/07/2017 CLINICAL DATA:  Abdominal pain. EXAM:  OBSTETRIC <14 WK [place] AND TRANSVAGINAL OB US TECHNIQUE: Both transabdominal and transvaginal ultrasound examinations were performed for complete evaluation of the gestation as well as the maternal uterus, adnexal regions, and pelvic cul-de-sac. Transvaginal technique was performed to assess early pregnancy. COMPARISON:  11/18/2011 FINDINGS: Intrauterine gestational sac: Possible early gestational sac in the fundal endometrium Yolk sac:  Not visualized Embryo:  Not visualized MSD: 2.3  mm   4 w   6  d Subchorionic hemorrhage:  None visualized. Maternal uterus/adnexae: Retroverted uterus. Unremarkable appearance of the ovaries. IMPRESSION: Possible early intrauterine gestational sac, but no yolk sac, fetal pole, or cardiac activity yet visualized. Recommend follow-up quantitative B-HCG levels and follow-up US in 14 days to assess viability. This recommendation follows SRU consensus guidelines: Diagnostic Criteria for Nonviable Pregnancy Early in the First Trimester. Malva Limes Med 2013; 782:9562-13. Electronically Signed   By: Sebastian Ache M.D.   On: 02/07/2017 16:51   P) f/u sonogram 2 weeks.  Pregnancy precautions. Pt instructed to call the office for appt Renold Kozar A, MD 3:29 PM 02/07/2017

## 2017-02-23 DIAGNOSIS — Z3201 Encounter for pregnancy test, result positive: Secondary | ICD-10-CM | POA: Diagnosis not present

## 2017-03-30 DIAGNOSIS — Z3689 Encounter for other specified antenatal screening: Secondary | ICD-10-CM | POA: Diagnosis not present

## 2017-03-30 DIAGNOSIS — Z3481 Encounter for supervision of other normal pregnancy, first trimester: Secondary | ICD-10-CM | POA: Diagnosis not present

## 2017-03-30 LAB — OB RESULTS CONSOLE HEPATITIS B SURFACE ANTIGEN: Hepatitis B Surface Ag: NEGATIVE

## 2017-03-30 LAB — OB RESULTS CONSOLE RPR: RPR: NONREACTIVE

## 2017-03-30 LAB — OB RESULTS CONSOLE HIV ANTIBODY (ROUTINE TESTING): HIV: NONREACTIVE

## 2017-03-30 LAB — OB RESULTS CONSOLE ANTIBODY SCREEN: ANTIBODY SCREEN: NEGATIVE

## 2017-03-30 LAB — OB RESULTS CONSOLE ABO/RH: RH Type: NEGATIVE

## 2017-03-30 LAB — OB RESULTS CONSOLE RUBELLA ANTIBODY, IGM: Rubella: IMMUNE

## 2017-04-06 DIAGNOSIS — Z118 Encounter for screening for other infectious and parasitic diseases: Secondary | ICD-10-CM | POA: Diagnosis not present

## 2017-04-06 DIAGNOSIS — Z3481 Encounter for supervision of other normal pregnancy, first trimester: Secondary | ICD-10-CM | POA: Diagnosis not present

## 2017-04-06 DIAGNOSIS — Z113 Encounter for screening for infections with a predominantly sexual mode of transmission: Secondary | ICD-10-CM | POA: Diagnosis not present

## 2017-05-05 DIAGNOSIS — Z3482 Encounter for supervision of other normal pregnancy, second trimester: Secondary | ICD-10-CM | POA: Diagnosis not present

## 2017-05-18 DIAGNOSIS — Z3A18 18 weeks gestation of pregnancy: Secondary | ICD-10-CM | POA: Diagnosis not present

## 2017-05-18 DIAGNOSIS — Z361 Encounter for antenatal screening for raised alphafetoprotein level: Secondary | ICD-10-CM | POA: Diagnosis not present

## 2017-05-18 DIAGNOSIS — O358XX0 Maternal care for other (suspected) fetal abnormality and damage, not applicable or unspecified: Secondary | ICD-10-CM | POA: Diagnosis not present

## 2017-05-25 DIAGNOSIS — Z3A19 19 weeks gestation of pregnancy: Secondary | ICD-10-CM | POA: Diagnosis not present

## 2017-05-25 DIAGNOSIS — Z3686 Encounter for antenatal screening for cervical length: Secondary | ICD-10-CM | POA: Diagnosis not present

## 2017-05-25 DIAGNOSIS — R102 Pelvic and perineal pain: Secondary | ICD-10-CM | POA: Diagnosis not present

## 2017-05-25 DIAGNOSIS — N760 Acute vaginitis: Secondary | ICD-10-CM | POA: Diagnosis not present

## 2017-05-25 DIAGNOSIS — O26892 Other specified pregnancy related conditions, second trimester: Secondary | ICD-10-CM | POA: Diagnosis not present

## 2017-06-16 DIAGNOSIS — O26892 Other specified pregnancy related conditions, second trimester: Secondary | ICD-10-CM | POA: Diagnosis not present

## 2017-06-16 DIAGNOSIS — Z3A22 22 weeks gestation of pregnancy: Secondary | ICD-10-CM | POA: Diagnosis not present

## 2017-07-14 DIAGNOSIS — O358XX0 Maternal care for other (suspected) fetal abnormality and damage, not applicable or unspecified: Secondary | ICD-10-CM | POA: Diagnosis not present

## 2017-07-14 DIAGNOSIS — Z3A26 26 weeks gestation of pregnancy: Secondary | ICD-10-CM | POA: Diagnosis not present

## 2017-07-30 DIAGNOSIS — O365930 Maternal care for other known or suspected poor fetal growth, third trimester, not applicable or unspecified: Secondary | ICD-10-CM | POA: Diagnosis not present

## 2017-07-30 DIAGNOSIS — O360130 Maternal care for anti-D [Rh] antibodies, third trimester, not applicable or unspecified: Secondary | ICD-10-CM | POA: Diagnosis not present

## 2017-07-30 DIAGNOSIS — Z3689 Encounter for other specified antenatal screening: Secondary | ICD-10-CM | POA: Diagnosis not present

## 2017-07-30 DIAGNOSIS — Z3A29 29 weeks gestation of pregnancy: Secondary | ICD-10-CM | POA: Diagnosis not present

## 2017-08-09 DIAGNOSIS — Z3A30 30 weeks gestation of pregnancy: Secondary | ICD-10-CM | POA: Diagnosis not present

## 2017-08-09 DIAGNOSIS — O365930 Maternal care for other known or suspected poor fetal growth, third trimester, not applicable or unspecified: Secondary | ICD-10-CM | POA: Diagnosis not present

## 2017-08-19 ENCOUNTER — Encounter (HOSPITAL_COMMUNITY): Payer: Self-pay

## 2017-08-19 ENCOUNTER — Inpatient Hospital Stay (HOSPITAL_COMMUNITY)
Admission: AD | Admit: 2017-08-19 | Discharge: 2017-08-19 | Disposition: A | Discharge status: Home / Self Care | Payer: BLUE CROSS/BLUE SHIELD | Source: Ambulatory Visit | Attending: Obstetrics and Gynecology | Admitting: Obstetrics and Gynecology

## 2017-08-19 DIAGNOSIS — Z87891 Personal history of nicotine dependence: Secondary | ICD-10-CM | POA: Insufficient documentation

## 2017-08-19 DIAGNOSIS — Z3A32 32 weeks gestation of pregnancy: Secondary | ICD-10-CM | POA: Diagnosis not present

## 2017-08-19 DIAGNOSIS — O4703 False labor before 37 completed weeks of gestation, third trimester: Secondary | ICD-10-CM

## 2017-08-19 DIAGNOSIS — O34211 Maternal care for low transverse scar from previous cesarean delivery: Secondary | ICD-10-CM | POA: Insufficient documentation

## 2017-08-19 LAB — URINALYSIS, ROUTINE W REFLEX MICROSCOPIC
BILIRUBIN URINE: NEGATIVE
Glucose, UA: NEGATIVE mg/dL
HGB URINE DIPSTICK: NEGATIVE
Ketones, ur: NEGATIVE mg/dL
Leukocytes, UA: NEGATIVE
Nitrite: NEGATIVE
PH: 7 (ref 5.0–8.0)
Protein, ur: NEGATIVE mg/dL
SPECIFIC GRAVITY, URINE: 1.01 (ref 1.005–1.030)

## 2017-08-19 NOTE — MAU Provider Note (Signed)
History     Chief Complaint  Patient presents with  . Contractions   31 yo G4P2  BF @ 33 ChriFransisca YolCardell PeachaFrances FKorearTri City OrthMarchelle GearOklahoma Outpatient Surgery Limited Partner59sh6Pollie MHeatheBraselYKentucJackely<MEASUREMEMarina Goodell>AveChriFransisca YolCardell PeachaFrances FKorearEncompass Health Rehabilitation Hospital OfMarchelle GearBeacon Children'S Hosp90it8PollSt Davids Austin Area Asc, LLC Dba St [facility] [place] SDoKentucJackely<MEASUREMEMarina Goodell>North Valley Surgery CentM103ChriFransisca YolCardell PeachaFrances FKorearCommuMarchelle GearSt George Surgical Cente22r 7PollNortheast Georgia Medical CentHeatheBaptist Health Medical Center - Hot Spring Cou191BaKentuckyKentucJackely<MEASUREMEMarina Goodell>Ely Bloomenson Comm HospitChriFransisca YolCardell PeachaFrances FKorearKissimmeMarchelle GearReno Endoscopy Center19 L6PollBoone MemorialHeathMaryland Diagnostic And TherKentucJackely<MEASUREMEMarina Goodell>Charleston Surgical HospitChriFransisca YolCardell PeachaFrances FKorearHealthsouth Rehabilitation HospMarchelle GearDuke Health Ashtabula Hosp68it4PollAllegheny VKentucJackely<MEASUREMEChriFransisca YolCardell PeachaFrances FKorearRichmMarchelle GearSelect Specialty Hospital Gulf C65oa2PollInova Fair OaksHeatheDigestive Disease Select Speciality Hospital Of KentucJackely<MEASUREMEMarina Goodell>Reeves MemoChriFransisca YolCardell PeachaFrances FKorearDhhs Phs Ihs TucsMarchelle GearCentro Cardiovascular De Pr Y Caribe Dr Ramon M Su33ar7PollPeninsula Eye HeatheLock Haven Hospi191BaKentucky37wVNew MDevereux Childrens BKentucJackely<MEASUREMEMarina Goodell>Strategic Behavioral Center LelaMChriFransisca YolCardell PeachaFrances FKorearLac/Harbor-UMarchelle GearTexas Health Harris Methodist Hospital Stephenv30il4PollBanner Gateway MedicHUpmcKentucJackely<MEASUREMEMarina GoodelChriFransisca YolCardell PeachaFrances FKorearFresnoMarchelle GearAvera Sacred Heart Hosp40itPollAssurance PsychiatricHeatheHeart HoKentucJackely<MEASUREMEMarina Goodell>Premier SChriFransisca YolCardell PeachaFrances FKorearOld Moultrie SMarchelle GearEndoscopy Associates Of Valley F58or5PollValley Children'SHeatheWamegAvera DeKentucJackely<MEASUREMEMarina Goodell>High Point Treatment CentM10w([phone] YolCardell PeachaFrances FKorearProvidence MedfMarchelle GearCsf - Ut60ua4PollBlack Canyon Surgical CHeatheTexas Health PresbytProvident KentucJackely<MEASUREMEMarina Goodell>Va Medical Center And Ambulatory Care ClChriFransisca YolCardell PeachaFrances FKorearKingsport Tn Opthalmology Asc LLC Dba The Regional Marchelle GearMidwest Eye Surgery Ce38nt7PollAdventist BolingbrookHeatheGastroenterology Consultants Of San Antonio SIvKentucJackely<MEASUREMEMarina Goodell>French Hospital Medical CentM79w(7ChriFransisca YolCardell PeachaFrances FKorearKendall Pointe Marchelle GearLauderdale Community Hosp3itPollLincoln County MedicHeatheWarren GasThe MKentucJackely<MEASUREMEMarina Goodell>StChriFransisca YolCardell PeachaFrances FKorearAtlantic Marchelle GearSamuel Mahelona Memorial Hosp41it8PollFayette County MemorialHeatheBeth Israel DeacoBeaKentucJackely<MEASUREMEMarina Goodell>Cataract Ctr OChriFransisca YolCardell PeachaFrances FKorearScl Health Community HosMarchelle GearTwin Valley Behavioral Health65ca1PollSouthern Crescent EndoscoChriFransisca YolCardell PeachaFrances FKorearSt. Mary RegioMarchelle GearWasc LLC Dba Wooster Ambulatory Surgery Ce61nt1Pollie MHeatheSoutheast Louisiana VeterSnoKentucJackely<MEASUREMEMarina Goodell>Va Medical Center - VancouverChriFransisca YolCardell PeachaFrances FKorearConroe Tx Endoscopy Asc LLC Dba River OakMarchelle GearAgmg Endoscopy Center A General Partner60sh4PollEndoscopy Center Of LittlHeatheSouth Florida Ambulatory Surgical Center 191BaKentucky27wVNew MexicoChattUh College Of Optometry Surgery Center KentucJackely<MEASUREMEMarina Goodell>Clinch MemoriaChriFransisca YolCardell PeachaFrances FKorearMountMarchelle GearMs Baptist Medical Ce59nt9PollGastrointestinal HealHeatheCommuVa Medical CentKentucJackely<MEASUREMEMarina GoodChriFransisca YolCardell PeachaFrances FKorearBig SaMarchelle GearSurgery Center Of Port Charlotte3 L2PollRooks County HealHeatheMarion KentucJackely<MEASUREMEMarina Goodell>Meade District HoChriFransisca YolCardell PeachaFrances FKorearBergen GMarchelle GearUnion Hospital Cli67nt2PollNorthwest RegionaHeatheVirtua West Jersey FayetteKentucJackely<MEASUREMEMarina Goodell>St. [facility] Regional ChriFransisca YolCardell PeachaFrances FKorearMetropolitan NashvillMarchelle GearKaiser Fnd Hosp - Orange County - Ana30he4PollWestern MassachusettsHeatheSaint ThSilver OKentucJackely<MEASUREMEMarina Goodell>Gastroenterology Endoscopy Cent69w613-638M409ISChriFransisca YolCardell PeachaFrances FKorearSeidenberg Protzko Marchelle GearStonewall Memorial Hosp33it3PollGoryeb ChildrEncompass Health Rehab KentucJackely<MEASUREMEMarina Goodell>CentinelaChriFransisca YolCardell PeachaFrances FKorearCarilion Giles Marchelle GearMadelia Community Hosp43it1PollMinimally Inv6HerbertCitizens Medical CeFrances FurbiFrCaromont Specialty Sur34ge2Pollie MHeatheNorthern Hospital Of Surry CouKentucky56nt84y1Kentu<MEASUREMEMarina Goodell22Rei409IllinoisI79nCleatis PolChChriFransisca YolCardell PeaKoreacSoMaEdward MJackely<MEASUREMEMarina Goodell>Utmb Angleton-Danbury Medical CentMChriFransisca YolCardell PeachaFrances FKorearOlive Ambulatory Surgery Center Dba North CamMarchelle GearNorthwestern Medical Ce11nt2PollMethodist Health Care - Olive BranchHeatheTreasure VWellsKentucJackely<MEASUREMEMarina Goodell>Encompass Rehabilitation HospitChriFransisca YolCardell PeachaFrances FKorearAnMarchelle GearGallup Indian Medical Ce47nt8PollPrincess Anne Ambulatory Surgery ManVa Medical CenterKentucJackely<MEASUREMEMarina Goodell>Ch Ambulatory Surgery Center Of Lopatcong 33w7ChriFransisca YolCardell PeachaFrances FKorearDMarchelle GearSt. Luke'S Magic Valley Medical Ce71nt9PollSurgcenter Of Westover HeatheSurgery Center Of [place] 191BaKentuckRosKentucJackely<MEASUREMEMarina Goodell>ClaChriFransisca YolCardell PeachaFrances FKorearFranklin CountyMarchelle GearMosaic Medical Ce26nt7PollWilmington GastroeHeatheBrighton SurgicalStarr KentucJackely<MEASUREMEMarina GoodChriFransisca YolCardell PeachaFrances FKorearInfirMarchelle GearKindred Hospital - Louisv60il2PollSouthpoint Surgery CHeatheNorthern Navajo MCentura Health-AvKentucJackely<MEASUREMEMarina Goodell>Lexington Regional HeaChriFransisca YolCardell PeachaFrances FKorearUnion HospitMarchelle GearDigestive Healthcare Of Ga68 L5PollFulton StateHeatheWillingway Hospi191BaKWillamettKentucJackely<MEASUREMEMarina Goodell>Presence Central And Suburban Hospitals Network Dba PrecencChriFransisca YolCardell PeachaFrances FKorearMercy RegioMarchelle GearLake Worth Surgical Ce54nt1PollCoral Springs Ambulatory Surgery CHeatheTexas Health Presbyterian Hospital Flower MSpartanburg RKentucJackely<MEASUREMEMarina Goodell>Lake Charles Memorial HospitaChriFransisca YolCardell PeachaFrances FKorearFairview RegioMarchelle GearDelta Endoscopy Cente43r 6PollRetina Consultants SurgeHeatCoaKentucJackely<MEASUREMEMarina Goodell>Regional RehabilitaChriFransisca YolCardell PeachaFrances FKorearSurgery CenteMarchelle GearAdventist Glen64oa7PollLawrence MemorialHeatheLifecare Hospitals OGreat Lakes Surgical Suites LLC Dba GreaKentucJackely<MEASUREMEMarina Goodell>South Suburban Surgical SuitMy6ChriFransisca YolCardell PeachaFrances FKorearBaylor Scott & White Medical CMarchelle GearCascade Surgicenter66 L4PollMid - Jefferson Extended Care Hospital OfHeatheMarian BehaviColmery-OKentucJackely<MEASUREMEMarina Goodell>Regional Medical Center BayonetChriFransisca YolCardell PeachaFrances FKorearNelson CoMarchelle GearAzar Eye Surgery Center51 L5PollTristar Southern Hills MedicHeatheRegency Hospital Of NortSelect SpeKentucJackely<MEASUREMEMarina Goodell>Crossing Rivers Health Medical Cen107w580ChriFransisca YolCardell PeachaFrances FKorearAllegiance Behavioral Health CMarchelle GearMimbres Memorial Hosp39it3PollFaulkton Area MedicHeatheMedstar-Georgetown Un6Herbert Lake Cumberland Regional Hospit62HeatheAllegheny General Hospita37l1Kentu<MEASUREMEMarina Goodell>Tmc Behavioral Health Cent48w418-296M409IlYork Endoscopy Center LLC Dba Upmc SChriFransisca YolCardell PeachaFrances FKoreMarchelle GearHigh Point Surgery Center59 L6PollLbj Tropical MedicHeatheThe Long Island Western Washington Medical Group Inc Ps DbaKentucJackely<MEASUREMEMarina Goodell>Blue Mountain HospitM89w331-121M40ChriFransisca YolCardell PeachaFrances FKorearCec SurMarchelle GearBucks County Gi Endoscopic Surgical Center82 L2PollHickory Ridge SuHeatheDepartment Of StatCommunity SurgeKentucJackely<MEASUREMEMarina Goodell>Redwood Surgery Cent105Rei40ChriFransisca YolCardell PeachaFrances FKorearLasting HoMarchelle Ge68aPollPhoenix Children191BaMoJackely<MEASUREMEMarina Goodell>Encompass Health Rehabilitation Hospital Of MemphM4w514-230M40The Cataract Surgery CeChriFransisca YolCardell PeachaFrances FKoreaMarchelle GearGalileo Surgery Cente46r 2PollSanta Maria Digestive DiagnostHeatheChi Health Richard Young BehaOutpatient SurgerKentucJackely<MEASUREMEMarina GoChriFransisca YolCardell PeachaFrances FKorearTexas Children'S HoMarchelle GearHima San Pablo - Hum82ac3PollHines Va MedicHeatheFacey MedicVirKentucJackely<MEASUREMEMarina Goodell>Central ConneChriFransisca YolCardell PeachaFrances FKorearPinnacle Cataract And LMarchelle GearCrockett Medical Ce89nt1PollGood Samaritan Medical CHeatheNeuropsychiatric Hospital Of IndianAbbotKentucJackely<MEASUREMEMarina Goodell>Desoto Eye SurgeChriFransisca YolCardell PeachaFrances FKorearOasisMarchelle GearEndocentre At Quarterfield Sta86ti6PollLifecare Specialty Hospital Of North HeatheRocklGreen KentucJackely<MEASUREMEMarina Goodell>Surgcenter Of SilvChriFransisca YolCardell PeachaFrances FKorearSt. Mary'S Healthcare - AmsterdMarchelle GearSurgery Center Of Eye Specialists Of Indian38a 7PollGastrointestinal Center Of HiHeatheCrittendBon Secours St.KentucJackely<MEASUREMEMarina Goodell>Atlanta Va Health MChriFransisca YolCardell PeachaFrances FKorearGi Wellness CenteMarchelle GearPalm Beach Surgical Suites46 L7PollAmerican RecoveHeaKentucJackely<MEASUREMEMaChriFransisca YolCardell PeachaFrances FKorearMemorial Care Surgical CenterMarchelle GearPasadena Plastic Surgery Center74 I3PollKings Daughters Medical CeHeatheBaptist Medical Park SurgerSalem MemKentucJackely<MEASUREMEMarina Goode53R<MEASUREMENTCharlSharene SJaneeCasimiro NeTheophilBaylor Scott & White Medical Ce<MEASUREMEMarina Goodell>Central Florida BehavChriFransisca YolCardell PeachaFrances FKorearQuail RunMarchelle GearAesculapian Surgery Center LLC Dba Intercoastal Medical Group Ambulatory Surgery Ce73ntPollNebraska MedicHeatheArkansas Valley Regional KentucJackely<MEASUREMEMarina Goodell>Eating Recovery Center A BehChriFransisca YolCardell PeachaFrances FKorearRobesoMarchelle GearLifecare Hospitals Of Chester Co86un7PollCorona Regional Medical CeHeatheParadise Valley Hsp D/P Aph BaAdvanced AmbulKentucJackely<MEASUREMEMarina GoodelChriFransisca YolCardell PeachaFrances FKorearRevision Advanced Marchelle GearCaldwell Memorial Hosp39it1PollMidwest SurgeHeatheSheltering Arms KentucJackely<MEASUREMEMarina Goodell>Vp Surgery CenteChriFransisca YolCardell PeachaFrances FKorearGasMarchelle GearSpicewood Surgery Ce32nt4PollLake Butler Hospital Hand SurgeHeatheGastroenterology Of Canton Endoscopy Center Inc Dba Goc EMiKentucJackely<MEASUREMEMarina Goodell>Manatee SurgicarChriFransisca YolCardell PeachaFrances FKorearAlbany Urology Surgery Center LLC Dba Albany UrolMarchelle GearAdventist Bolingbrook Hosp87it5PollValley Laser And Surgery CHeatheMayo Clinic Health System - NorthlaBouKentucJackely<MEASUREMEMarina GooChriFransisca YolCardell PeachaFrances FKorearSeaMarchelle GearSoma Surgery Ce83nt2PollLas Palmas MedicHeThomas HKentucJackely<MEASUREMEMarina Goodell>Madison Hospit47wChriFransisca YolCardell PeachaFrances FKorearCampbell CountyMarchelle GearEl Paso Center For Gastrointestinal Endoscopy34 L8PollPacific Endo Surgical HeatheMedicalHigh DeKentucJackely<MEASUREMEMarina Goodell>MetChriFransisca YolCardell PeachaFrances FKorearBrass Partnership In Commendam Dba BrMarchelle GearFerry County Memorial Hosp32it2PollEncompass Health Rehabilitation Hospital Of HeatheMemorial Medical Center - Ashl1Sacred HeaKentucJackely<MEASUREMEMarina Goodell>Physician'[facility] - FrChriFransisca YolCardell PeachaFrances FKorearScl Health Community HosMarchelle GearAscension River District Hosp27it3PollLittle River MemorialHeatheMcbride Orthopedic HoRedmond KentucJackely<MEASUREMEMarina Goodell>DecaChriFransisca YolCardell PeachaFrances FKorearBaptist HeaMarchelle GearBaylor Scott And White Texas Spine And Joint Hosp32itPollThe Medical Center HeatheWestlakeKentucJackely<MEASUREMEMarina Goodell>PChriFransisca YolCardell PeachaFrances FKorearSelect Specialty HosMarchelle GearDixie Regional Medical Center - River Road Ca69mp5PollIndiana University Health White MemorialHeatheSalem Regional MedicKentucJackelyn KnifeeTewksbury HospitalexicoCentura Health-St Francis Medical Centeron75nGala Lewando AG> sx. (+) FM. Admits to min water intake today  OB History    Gravida Para Term Preterm AB Living   4 2 1 1 1 3    SAB TAB Ectopic Multiple Live Births   0 0 1 1 2       Past Medical History:  Diagnosis Date  . Chlamydia ?2007  . Heart murmur   . Urinary tract infection     Past Surgical History:  Procedure Laterality Date  . CESAREAN SECTION    . CESAREAN SECTION  07/20/2012   Procedure: CESAREAN SECTION;  Surgeon: Lisa Jackson-Moore, MD;  Location: [facility];  Service: Gynecology;  Laterality: N/A;  . LAPAROSCOPY FOR ECTOPIC PREGNANCY  Feb 2012  . SALPINGECTOMY      Family History  Problem Relation Age of Onset  . Anesthesia problems Neg Hx   . Hypotension Neg Hx   . Malignant hyperthermia Neg Hx   . Pseudochol deficiency Neg Hx   . Other Neg Hx     Social History  Substance Use Topics  . Smoking status: Former Smoker    Quit date: 12/06/2011  . Smokeless tobacco: Never Used  . Alcohol use No    Allergies: No Known Allergies  Prescriptions Prior to Admission  Medication Sig Dispense Refill Last Dose  . albuterol (PROVENTIL HFA;VENTOLIN HFA) 108 (90 BASE) MCG/ACT inhaler Inhale 2 puffs into the lungs every 6 (six) hours as needed. SOB   Unknown at Unknown     Physical Exam   Blood pressure (!) 116/54, pulse 94, temperature 98.3 F (36.8 C), temperature source Oral, resp. rate 18, height 5\' 3"  (1.6 m), weight 70.8 kg (156 lb), last menstrual period 01/07/2017, SpO2 100 %, currently breastfeeding.  General appearance: alert, cooperative and no distress Abdomen: gravid nontender Pelvic: external genitalia normal and closed/thick/posterior/OOP  Tracing: baseline 140 (+) accel 155. UI.   ED Course  IMP: PMC w/o no evidence of  PTL  IUP @ 32 weeks P)  U/A. PTL prec. D/c home. Keep sched appt 10/1  MDM   Auriel Kist A, MD 1:39 PM 08/19/2017

## 2017-08-19 NOTE — Discharge Instructions (Signed)
Braxton Hicks Contractions Contractions of the uterus can occur throughout pregnancy, but they are not always a sign that you are in labor. You may have practice contractions called Braxton Hicks contractions. These false labor contractions are sometimes confused with true labor. What are Montine Circle contractions? Braxton Hicks contractions are tightening movements that occur in the muscles of the uterus before labor. Unlike true labor contractions, these contractions do not result in opening (dilation) and thinning of the cervix. Toward the end of pregnancy (32-34 weeks), Braxton Hicks contractions can happen more often and may become stronger. These contractions are sometimes difficult to tell apart from true labor because they can be very uncomfortable. You should not feel embarrassed if you go to the hospital with false labor. Sometimes, the only way to tell if you are in true labor is for your health care provider to look for changes in the cervix. The health care provider will do a physical exam and may monitor your contractions. If you are not in true labor, the exam should show that your cervix is not dilating and your water has not broken. If there are no prenatal problems or other health problems associated with your pregnancy, it is completely safe for you to be sent home with false labor. You may continue to have Braxton Hicks contractions until you go into true labor. How can I tell the difference between true labor and false labor?  Differences ? False labor ? Contractions last 30-70 seconds.: Contractions are usually shorter and not as strong as true labor contractions. ? Contractions become very regular.: Contractions are usually irregular. ? Discomfort is usually felt in the top of the uterus, and it spreads to the lower abdomen and low back.: Contractions are often felt in the front of the lower abdomen and in the groin. ? Contractions do not go away with walking.: Contractions  may go away when you walk around or change positions while lying down. ? Contractions usually become more intense and increase in frequency.: Contractions get weaker and are shorter-lasting as time goes on. ? The cervix dilates and gets thinner.: The cervix usually does not dilate or become thin. Follow these instructions at home:  Take over-the-counter and prescription medicines only as told by your health care provider.  Keep up with your usual exercises and follow other instructions from your health care provider.  Eat and drink lightly if you think you are going into labor.  If Braxton Hicks contractions are making you uncomfortable: ? Change your position from lying down or resting to walking, or change from walking to resting. ? Sit and rest in a tub of warm water. ? Drink enough fluid to keep your urine clear or pale yellow. Dehydration may cause these contractions. ? Do slow and deep breathing several times an hour.  Keep all follow-up prenatal visits as told by your health care provider. This is important. Contact a health care provider if:  You have a fever.  You have continuous pain in your abdomen. Get help right away if:  Your contractions become stronger, more regular, and closer together.  You have fluid leaking or gushing from your vagina.  You pass blood-tinged mucus (bloody show).  You have bleeding from your vagina.  You have low back pain that you never had before.  You feel your babys head pushing down and causing pelvic pressure.  Your baby is not moving inside you as much as it used to. Summary  Contractions that occur before labor  are called Braxton Hicks contractions, false labor, or practice contractions.  Braxton Hicks contractions are usually shorter, weaker, farther apart, and less regular than true labor contractions. True labor contractions usually become progressively stronger and regular and they become more frequent.  Manage discomfort  from [facility] contractions by changing position, resting in a warm bath, drinking plenty of water, or practicing deep breathing. This information is not intended to replace advice given to you by your health care provider. Make sure you discuss any questions you have with your health care provider. Document Released: 11/09/2005 Document Revised: 09/28/2016 Document Reviewed: 09/28/2016 Elsevier Interactive Patient Education  2017 [employer]. PTL precautions

## 2017-08-19 NOTE — MAU Note (Signed)
Pt presents with complaint of a lot of vaginal pressure last pm, also reports contractions q 15-18 minutes this am. Denies bleeding, ROM

## 2017-08-23 DIAGNOSIS — L292 Pruritus vulvae: Secondary | ICD-10-CM | POA: Diagnosis not present

## 2017-08-23 DIAGNOSIS — O365930 Maternal care for other known or suspected poor fetal growth, third trimester, not applicable or unspecified: Secondary | ICD-10-CM | POA: Diagnosis not present

## 2017-08-23 DIAGNOSIS — Z3A32 32 weeks gestation of pregnancy: Secondary | ICD-10-CM | POA: Diagnosis not present

## 2017-08-24 ENCOUNTER — Other Ambulatory Visit: Payer: Self-pay | Admitting: Obstetrics and Gynecology

## 2017-09-08 DIAGNOSIS — Z3A34 34 weeks gestation of pregnancy: Secondary | ICD-10-CM | POA: Diagnosis not present

## 2017-09-08 DIAGNOSIS — O365930 Maternal care for other known or suspected poor fetal growth, third trimester, not applicable or unspecified: Secondary | ICD-10-CM | POA: Diagnosis not present

## 2017-09-13 DIAGNOSIS — Z3685 Encounter for antenatal screening for Streptococcus B: Secondary | ICD-10-CM | POA: Diagnosis not present

## 2017-09-13 DIAGNOSIS — Z3A35 35 weeks gestation of pregnancy: Secondary | ICD-10-CM | POA: Diagnosis not present

## 2017-09-13 DIAGNOSIS — O365930 Maternal care for other known or suspected poor fetal growth, third trimester, not applicable or unspecified: Secondary | ICD-10-CM | POA: Diagnosis not present

## 2017-09-13 LAB — OB RESULTS CONSOLE GBS: STREP GROUP B AG: NEGATIVE

## 2017-09-20 DIAGNOSIS — O365930 Maternal care for other known or suspected poor fetal growth, third trimester, not applicable or unspecified: Secondary | ICD-10-CM | POA: Diagnosis not present

## 2017-09-20 DIAGNOSIS — Z3A36 36 weeks gestation of pregnancy: Secondary | ICD-10-CM | POA: Diagnosis not present

## 2017-09-29 ENCOUNTER — Telehealth (HOSPITAL_COMMUNITY): Payer: Self-pay | Admitting: *Deleted

## 2017-09-29 ENCOUNTER — Encounter (HOSPITAL_COMMUNITY): Payer: Self-pay

## 2017-09-29 NOTE — Telephone Encounter (Signed)
Preadmission screen  

## 2017-09-30 ENCOUNTER — Encounter (HOSPITAL_COMMUNITY): Payer: Self-pay

## 2017-09-30 DIAGNOSIS — O365930 Maternal care for other known or suspected poor fetal growth, third trimester, not applicable or unspecified: Secondary | ICD-10-CM | POA: Diagnosis not present

## 2017-09-30 DIAGNOSIS — Z3A38 38 weeks gestation of pregnancy: Secondary | ICD-10-CM | POA: Diagnosis not present

## 2017-10-08 ENCOUNTER — Encounter (HOSPITAL_COMMUNITY)
Admission: RE | Admit: 2017-10-08 | Discharge: 2017-10-08 | Disposition: A | Discharge status: Home / Self Care | Payer: BLUE CROSS/BLUE SHIELD | Source: Ambulatory Visit | Attending: Obstetrics and Gynecology | Admitting: Obstetrics and Gynecology

## 2017-10-08 DIAGNOSIS — Z87891 Personal history of nicotine dependence: Secondary | ICD-10-CM | POA: Diagnosis not present

## 2017-10-08 DIAGNOSIS — Z6791 Unspecified blood type, Rh negative: Secondary | ICD-10-CM | POA: Diagnosis not present

## 2017-10-08 DIAGNOSIS — O34211 Maternal care for low transverse scar from previous cesarean delivery: Secondary | ICD-10-CM | POA: Diagnosis not present

## 2017-10-08 DIAGNOSIS — D62 Acute posthemorrhagic anemia: Secondary | ICD-10-CM | POA: Diagnosis not present

## 2017-10-08 DIAGNOSIS — Z3A39 39 weeks gestation of pregnancy: Secondary | ICD-10-CM | POA: Diagnosis not present

## 2017-10-08 DIAGNOSIS — O358XX0 Maternal care for other (suspected) fetal abnormality and damage, not applicable or unspecified: Secondary | ICD-10-CM | POA: Diagnosis not present

## 2017-10-08 DIAGNOSIS — O9081 Anemia of the puerperium: Secondary | ICD-10-CM | POA: Diagnosis not present

## 2017-10-08 DIAGNOSIS — Z3A00 Weeks of gestation of pregnancy not specified: Secondary | ICD-10-CM | POA: Diagnosis not present

## 2017-10-08 DIAGNOSIS — O26893 Other specified pregnancy related conditions, third trimester: Secondary | ICD-10-CM | POA: Diagnosis not present

## 2017-10-08 HISTORY — DX: Vitamin D deficiency, unspecified: E55.9

## 2017-10-08 LAB — CBC
HEMATOCRIT: 31.8 % — AB (ref 36.0–46.0)
HEMOGLOBIN: 10.4 g/dL — AB (ref 12.0–15.0)
MCH: 30.2 pg (ref 26.0–34.0)
MCHC: 32.7 g/dL (ref 30.0–36.0)
MCV: 92.4 fL (ref 78.0–100.0)
Platelets: 205 10*3/uL (ref 150–400)
RBC: 3.44 MIL/uL — ABNORMAL LOW (ref 3.87–5.11)
RDW: 13.8 % (ref 11.5–15.5)
WBC: 5.3 10*3/uL (ref 4.0–10.5)

## 2017-10-08 LAB — TYPE AND SCREEN
ABO/RH(D): A NEG
ANTIBODY SCREEN: NEGATIVE

## 2017-10-08 NOTE — Patient Instructions (Signed)
[name]  10/08/2017   Your procedure is scheduled on:  10/09/2017  [facility]  Pick up the phone at the desk and dial [phone]  Call this number if you have problems the morning of surgery:[phone]  Remember:   Do not eat food:After Midnight.  Do not drink clear liquids: After Midnight.  Take these medicines the morning of surgery with A SIP OF WATER: none   Do not wear jewelry, make-up or nail polish.  Do not wear lotions, powders, or perfumes. Do not wear deodorant.  Do not shave 48 hours prior to surgery.  Do not bring valuables to the hospital.  [facility] not   responsible for any belongings or valuables brought to the hospital.  Contacts, dentures or bridgework may not be worn into surgery.  Leave suitcase in the car. After surgery it may be brought to your room.  For patients admitted to the hospital, checkout time is 11:00 AM the day of              discharge.    N/A   Please read over the following fact sheets that you were given:   Surgical Site Infection Prevention

## 2017-10-09 ENCOUNTER — Other Ambulatory Visit: Payer: Self-pay

## 2017-10-09 ENCOUNTER — Inpatient Hospital Stay (HOSPITAL_COMMUNITY): Payer: BLUE CROSS/BLUE SHIELD | Admitting: Anesthesiology

## 2017-10-09 ENCOUNTER — Inpatient Hospital Stay (HOSPITAL_COMMUNITY)
Admission: RE | Admit: 2017-10-09 | Discharge: 2017-10-11 | DRG: 787 | Disposition: A | Discharge status: Home / Self Care | Payer: BLUE CROSS/BLUE SHIELD | Source: Ambulatory Visit | Attending: Obstetrics and Gynecology | Admitting: Obstetrics and Gynecology

## 2017-10-09 ENCOUNTER — Encounter (HOSPITAL_COMMUNITY)
Admission: RE | Disposition: A | Discharge status: Home / Self Care | Payer: Self-pay | Source: Ambulatory Visit | Attending: Obstetrics and Gynecology

## 2017-10-09 ENCOUNTER — Encounter (HOSPITAL_COMMUNITY): Payer: Self-pay

## 2017-10-09 DIAGNOSIS — Z3A39 39 weeks gestation of pregnancy: Secondary | ICD-10-CM | POA: Diagnosis not present

## 2017-10-09 DIAGNOSIS — D62 Acute posthemorrhagic anemia: Secondary | ICD-10-CM | POA: Diagnosis not present

## 2017-10-09 DIAGNOSIS — O26893 Other specified pregnancy related conditions, third trimester: Secondary | ICD-10-CM | POA: Diagnosis present

## 2017-10-09 DIAGNOSIS — O9902 Anemia complicating childbirth: Secondary | ICD-10-CM | POA: Diagnosis present

## 2017-10-09 DIAGNOSIS — Z87891 Personal history of nicotine dependence: Secondary | ICD-10-CM

## 2017-10-09 DIAGNOSIS — O358XX0 Maternal care for other (suspected) fetal abnormality and damage, not applicable or unspecified: Secondary | ICD-10-CM | POA: Diagnosis present

## 2017-10-09 DIAGNOSIS — Z3A00 Weeks of gestation of pregnancy not specified: Secondary | ICD-10-CM | POA: Diagnosis not present

## 2017-10-09 DIAGNOSIS — O9081 Anemia of the puerperium: Secondary | ICD-10-CM | POA: Diagnosis not present

## 2017-10-09 DIAGNOSIS — O34211 Maternal care for low transverse scar from previous cesarean delivery: Principal | ICD-10-CM | POA: Diagnosis present

## 2017-10-09 DIAGNOSIS — Z6791 Unspecified blood type, Rh negative: Secondary | ICD-10-CM

## 2017-10-09 LAB — RPR: RPR: NONREACTIVE

## 2017-10-09 SURGERY — Surgical Case
Anesthesia: Spinal | Site: Abdomen | Wound class: Clean Contaminated

## 2017-10-09 MED ORDER — SIMETHICONE 80 MG PO CHEW
80.0000 mg | CHEWABLE_TABLET | Freq: Three times a day (TID) | ORAL | Status: DC
  Administered 2017-10-09 – 2017-10-11 (×5): 80 mg via ORAL
  Filled 2017-10-09 (×6): qty 1

## 2017-10-09 MED ORDER — SENNOSIDES-DOCUSATE SODIUM 8.6-50 MG PO TABS
2.0000 | ORAL_TABLET | ORAL | Status: DC
  Administered 2017-10-10 (×2): 2 via ORAL
  Filled 2017-10-09 (×2): qty 2

## 2017-10-09 MED ORDER — BUPIVACAINE HCL (PF) 0.25 % IJ SOLN
INTRAMUSCULAR | Status: DC | PRN
  Administered 2017-10-09: 20 mL

## 2017-10-09 MED ORDER — FENTANYL CITRATE (PF) 100 MCG/2ML IJ SOLN
INTRAMUSCULAR | Status: AC
  Filled 2017-10-09: qty 2

## 2017-10-09 MED ORDER — DEXAMETHASONE SODIUM PHOSPHATE 10 MG/ML IJ SOLN
INTRAMUSCULAR | Status: DC | PRN
  Administered 2017-10-09: 10 mg via INTRAVENOUS

## 2017-10-09 MED ORDER — NALBUPHINE HCL 10 MG/ML IJ SOLN
5.0000 mg | Freq: Once | INTRAMUSCULAR | Status: AC | PRN
  Administered 2017-10-09: 5 mg via SUBCUTANEOUS

## 2017-10-09 MED ORDER — OXYTOCIN 10 UNIT/ML IJ SOLN
INTRAMUSCULAR | Status: DC | PRN
  Administered 2017-10-09: 40 [IU] via INTRAVENOUS

## 2017-10-09 MED ORDER — OXYTOCIN 40 UNITS IN LACTATED RINGERS INFUSION - SIMPLE MED: 2.5000 [IU]/h | INTRAVENOUS | Status: AC

## 2017-10-09 MED ORDER — DEXAMETHASONE SODIUM PHOSPHATE 10 MG/ML IJ SOLN
INTRAMUSCULAR | Status: AC
  Filled 2017-10-09: qty 1

## 2017-10-09 MED ORDER — OXYTOCIN 10 UNIT/ML IJ SOLN
INTRAMUSCULAR | Status: AC
  Filled 2017-10-09: qty 4

## 2017-10-09 MED ORDER — PHENYLEPHRINE 8 MG IN D5W 100 ML (0.08MG/ML) PREMIX OPTIME
INJECTION | INTRAVENOUS | Status: AC
  Filled 2017-10-09: qty 100

## 2017-10-09 MED ORDER — IBUPROFEN 600 MG PO TABS
600.0000 mg | ORAL_TABLET | Freq: Four times a day (QID) | ORAL | Status: DC
  Administered 2017-10-09 – 2017-10-11 (×8): 600 mg via ORAL
  Filled 2017-10-09 (×8): qty 1

## 2017-10-09 MED ORDER — NALBUPHINE HCL 10 MG/ML IJ SOLN: 5.0000 mg | INTRAMUSCULAR | Status: DC | PRN

## 2017-10-09 MED ORDER — SCOPOLAMINE 1 MG/3DAYS TD PT72
MEDICATED_PATCH | TRANSDERMAL | Status: AC
  Filled 2017-10-09: qty 1

## 2017-10-09 MED ORDER — COCONUT OIL OIL: 1.0000 "application " | TOPICAL_OIL | Status: DC | PRN

## 2017-10-09 MED ORDER — SCOPOLAMINE 1 MG/3DAYS TD PT72: 1.0000 | MEDICATED_PATCH | Freq: Once | TRANSDERMAL | Status: DC

## 2017-10-09 MED ORDER — DIPHENHYDRAMINE HCL 25 MG PO CAPS
25.0000 mg | ORAL_CAPSULE | Freq: Four times a day (QID) | ORAL | Status: DC | PRN
  Filled 2017-10-09 (×2): qty 1

## 2017-10-09 MED ORDER — ONDANSETRON HCL 4 MG/2ML IJ SOLN
INTRAMUSCULAR | Status: AC
Start: 2017-10-09 — End: 2017-10-09
  Filled 2017-10-09: qty 2

## 2017-10-09 MED ORDER — NALOXONE HCL 0.4 MG/ML IJ SOLN: 0.4000 mg | INTRAMUSCULAR | Status: DC | PRN

## 2017-10-09 MED ORDER — FENTANYL CITRATE (PF) 100 MCG/2ML IJ SOLN: 25.0000 ug | INTRAMUSCULAR | Status: DC | PRN

## 2017-10-09 MED ORDER — NALOXONE HCL 0.4 MG/ML IJ SOLN
1.0000 ug/kg/h | INTRAVENOUS | Status: DC | PRN
  Filled 2017-10-09: qty 5

## 2017-10-09 MED ORDER — NALBUPHINE HCL 10 MG/ML IJ SOLN
5.0000 mg | INTRAMUSCULAR | Status: DC | PRN
  Administered 2017-10-09 – 2017-10-10 (×3): 5 mg via INTRAVENOUS
  Filled 2017-10-09 (×3): qty 1

## 2017-10-09 MED ORDER — SOD CITRATE-CITRIC ACID 500-334 MG/5ML PO SOLN
ORAL | Status: AC
  Filled 2017-10-09: qty 15

## 2017-10-09 MED ORDER — BUPIVACAINE IN DEXTROSE 0.75-8.25 % IT SOLN
INTRATHECAL | Status: DC | PRN
  Administered 2017-10-09: 1.6 mL via INTRATHECAL

## 2017-10-09 MED ORDER — WITCH HAZEL-GLYCERIN EX PADS: 1.0000 "application " | MEDICATED_PAD | CUTANEOUS | Status: DC | PRN

## 2017-10-09 MED ORDER — LACTATED RINGERS IV SOLN: INTRAVENOUS | Status: DC

## 2017-10-09 MED ORDER — PHENYLEPHRINE 8 MG IN D5W 100 ML (0.08MG/ML) PREMIX OPTIME
INJECTION | INTRAVENOUS | Status: DC | PRN
Start: 2017-10-09 — End: 2017-10-09
  Administered 2017-10-09: 60 ug/min via INTRAVENOUS

## 2017-10-09 MED ORDER — MORPHINE SULFATE (PF) 0.5 MG/ML IJ SOLN
INTRAMUSCULAR | Status: AC
  Filled 2017-10-09: qty 10

## 2017-10-09 MED ORDER — ZOLPIDEM TARTRATE 5 MG PO TABS: 5.0000 mg | ORAL_TABLET | Freq: Every evening | ORAL | Status: DC | PRN

## 2017-10-09 MED ORDER — RHO D IMMUNE GLOBULIN 1500 UNIT/2ML IJ SOSY
300.0000 ug | PREFILLED_SYRINGE | Freq: Once | INTRAMUSCULAR | Status: AC
  Administered 2017-10-10: 300 ug via INTRAVENOUS
  Filled 2017-10-09: qty 2

## 2017-10-09 MED ORDER — SIMETHICONE 80 MG PO CHEW: 80.0000 mg | CHEWABLE_TABLET | ORAL | Status: DC | PRN

## 2017-10-09 MED ORDER — BUPIVACAINE IN DEXTROSE 0.75-8.25 % IT SOLN
INTRATHECAL | Status: AC
  Filled 2017-10-09: qty 2

## 2017-10-09 MED ORDER — SOD CITRATE-CITRIC ACID 500-334 MG/5ML PO SOLN
30.0000 mL | Freq: Once | ORAL | Status: AC
  Administered 2017-10-09: 30 mL via ORAL

## 2017-10-09 MED ORDER — ONDANSETRON HCL 4 MG/2ML IJ SOLN
4.0000 mg | Freq: Three times a day (TID) | INTRAMUSCULAR | Status: DC | PRN
  Administered 2017-10-09: 4 mg via INTRAVENOUS
  Filled 2017-10-09: qty 2

## 2017-10-09 MED ORDER — KETOROLAC TROMETHAMINE 30 MG/ML IJ SOLN
INTRAMUSCULAR | Status: AC
  Filled 2017-10-09: qty 1

## 2017-10-09 MED ORDER — DIPHENHYDRAMINE HCL 50 MG/ML IJ SOLN
12.5000 mg | INTRAMUSCULAR | Status: DC | PRN
Start: 2017-10-09 — End: 2017-10-11

## 2017-10-09 MED ORDER — PRENATAL MULTIVITAMIN CH
1.0000 | ORAL_TABLET | Freq: Every day | ORAL | Status: DC
  Administered 2017-10-09 – 2017-10-10 (×2): 1 via ORAL
  Filled 2017-10-09 (×2): qty 1

## 2017-10-09 MED ORDER — DIPHENHYDRAMINE HCL 25 MG PO CAPS
25.0000 mg | ORAL_CAPSULE | ORAL | Status: DC | PRN
  Administered 2017-10-09 – 2017-10-10 (×4): 25 mg via ORAL
  Filled 2017-10-09 (×3): qty 1

## 2017-10-09 MED ORDER — OXYCODONE-ACETAMINOPHEN 5-325 MG PO TABS: 1.0000 | ORAL_TABLET | ORAL | Status: DC | PRN

## 2017-10-09 MED ORDER — KETOROLAC TROMETHAMINE 30 MG/ML IJ SOLN: 30.0000 mg | Freq: Four times a day (QID) | INTRAMUSCULAR | Status: AC | PRN

## 2017-10-09 MED ORDER — LACTATED RINGERS IV SOLN
INTRAVENOUS | Status: DC | PRN
  Administered 2017-10-09: 09:00:00 via INTRAVENOUS

## 2017-10-09 MED ORDER — MORPHINE SULFATE (PF) 0.5 MG/ML IJ SOLN
INTRAMUSCULAR | Status: DC | PRN
  Administered 2017-10-09: 200 ug via INTRATHECAL

## 2017-10-09 MED ORDER — FENTANYL CITRATE (PF) 100 MCG/2ML IJ SOLN
INTRAMUSCULAR | Status: DC | PRN
  Administered 2017-10-09: 10 ug via INTRATHECAL

## 2017-10-09 MED ORDER — SIMETHICONE 80 MG PO CHEW
80.0000 mg | CHEWABLE_TABLET | ORAL | Status: DC
  Administered 2017-10-10: 80 mg via ORAL
  Filled 2017-10-09 (×2): qty 1

## 2017-10-09 MED ORDER — MENTHOL 3 MG MT LOZG: 1.0000 | LOZENGE | OROMUCOSAL | Status: DC | PRN

## 2017-10-09 MED ORDER — SODIUM CHLORIDE 0.9% FLUSH: 3.0000 mL | INTRAVENOUS | Status: DC | PRN

## 2017-10-09 MED ORDER — BUPIVACAINE HCL (PF) 0.25 % IJ SOLN
INTRAMUSCULAR | Status: AC
Start: 2017-10-09 — End: 2017-10-09
  Filled 2017-10-09: qty 30

## 2017-10-09 MED ORDER — LACTATED RINGERS IV SOLN
INTRAVENOUS | Status: DC
  Administered 2017-10-09 (×2): via INTRAVENOUS

## 2017-10-09 MED ORDER — SCOPOLAMINE 1 MG/3DAYS TD PT72
MEDICATED_PATCH | TRANSDERMAL | Status: DC | PRN
  Administered 2017-10-09: 1 via TRANSDERMAL

## 2017-10-09 MED ORDER — DIBUCAINE 1 % RE OINT: 1.0000 "application " | TOPICAL_OINTMENT | RECTAL | Status: DC | PRN

## 2017-10-09 MED ORDER — CEFAZOLIN SODIUM-DEXTROSE 2-3 GM-%(50ML) IV SOLR
INTRAVENOUS | Status: AC
  Filled 2017-10-09: qty 50

## 2017-10-09 MED ORDER — NALBUPHINE HCL 10 MG/ML IJ SOLN: 5.0000 mg | Freq: Once | INTRAMUSCULAR | Status: AC | PRN

## 2017-10-09 MED ORDER — NALBUPHINE HCL 10 MG/ML IJ SOLN
INTRAMUSCULAR | Status: AC
  Filled 2017-10-09: qty 1

## 2017-10-09 MED ORDER — ONDANSETRON HCL 4 MG/2ML IJ SOLN
INTRAMUSCULAR | Status: DC | PRN
  Administered 2017-10-09: 4 mg via INTRAVENOUS

## 2017-10-09 MED ORDER — KETOROLAC TROMETHAMINE 30 MG/ML IJ SOLN
30.0000 mg | Freq: Four times a day (QID) | INTRAMUSCULAR | Status: AC | PRN
  Administered 2017-10-09: 30 mg via INTRAMUSCULAR

## 2017-10-09 MED ORDER — OXYCODONE-ACETAMINOPHEN 5-325 MG PO TABS: 2.0000 | ORAL_TABLET | ORAL | Status: DC | PRN

## 2017-10-09 MED ORDER — CEFAZOLIN SODIUM-DEXTROSE 2-4 GM/100ML-% IV SOLN
2.0000 g | INTRAVENOUS | Status: AC
  Administered 2017-10-09: 2 g via INTRAVENOUS

## 2017-10-09 MED ORDER — PROMETHAZINE HCL 25 MG/ML IJ SOLN: 6.2500 mg | INTRAMUSCULAR | Status: DC | PRN

## 2017-10-09 MED ORDER — MEPERIDINE HCL 25 MG/ML IJ SOLN: 6.2500 mg | INTRAMUSCULAR | Status: DC | PRN

## 2017-10-09 SURGICAL SUPPLY — 44 items
APL SKNCLS STERI-STRIP NONHPOA (GAUZE/BANDAGES/DRESSINGS) ×1
BARRIER ADHS 3X4 INTERCEED (GAUZE/BANDAGES/DRESSINGS) ×2 IMPLANT
BENZOIN TINCTURE PRP APPL 2/3 (GAUZE/BANDAGES/DRESSINGS) ×1 IMPLANT
BRR ADH 4X3 ABS CNTRL BYND (GAUZE/BANDAGES/DRESSINGS) ×1
CHLORAPREP W/TINT 26ML (MISCELLANEOUS) ×2 IMPLANT
CLAMP CORD UMBIL (MISCELLANEOUS) IMPLANT
CLOTH BEACON ORANGE TIMEOUT ST (SAFETY) ×2 IMPLANT
CONTAINER PREFILL 10% NBF 15ML (MISCELLANEOUS) IMPLANT
DRAPE C SECTION CLR SCREEN (DRAPES) ×2 IMPLANT
DRSG OPSITE POSTOP 4X10 (GAUZE/BANDAGES/DRESSINGS) ×2 IMPLANT
ELECT REM PT RETURN 9FT ADLT (ELECTROSURGICAL) ×2
ELECTRODE REM PT RTRN 9FT ADLT (ELECTROSURGICAL) ×1 IMPLANT
EXTRACTOR VACUUM M CUP 4 TUBE (SUCTIONS) IMPLANT
GLOVE BIOGEL PI IND STRL 7.0 (GLOVE) ×2 IMPLANT
GLOVE BIOGEL PI INDICATOR 7.0 (GLOVE) ×2
GLOVE ECLIPSE 6.5 STRL STRAW (GLOVE) ×2 IMPLANT
GOWN STRL REUS W/TWL LRG LVL3 (GOWN DISPOSABLE) ×4 IMPLANT
KIT ABG SYR 3ML LUER SLIP (SYRINGE) IMPLANT
NDL HYPO 25X5/8 SAFETYGLIDE (NEEDLE) IMPLANT
NEEDLE HYPO 22GX1.5 SAFETY (NEEDLE) ×2 IMPLANT
NEEDLE HYPO 25X5/8 SAFETYGLIDE (NEEDLE) IMPLANT
NS IRRIG 1000ML POUR BTL (IV SOLUTION) ×2 IMPLANT
PACK C SECTION WH (CUSTOM PROCEDURE TRAY) ×2 IMPLANT
PAD OB MATERNITY 4.3X12.25 (PERSONAL CARE ITEMS) ×2 IMPLANT
RTRCTR C-SECT PINK 25CM LRG (MISCELLANEOUS) ×1 IMPLANT
STRIP CLOSURE SKIN 1/2X4 (GAUZE/BANDAGES/DRESSINGS) ×1 IMPLANT
SUT CHROMIC GUT AB #0 18 (SUTURE) IMPLANT
SUT MNCRL 0 VIOLET CTX 36 (SUTURE) ×3 IMPLANT
SUT MON AB 2-0 SH 27 (SUTURE)
SUT MON AB 2-0 SH27 (SUTURE) IMPLANT
SUT MON AB 3-0 SH 27 (SUTURE)
SUT MON AB 3-0 SH27 (SUTURE) IMPLANT
SUT MON AB 4-0 PS1 27 (SUTURE) IMPLANT
SUT MONOCRYL 0 CTX 36 (SUTURE) ×3
SUT PLAIN 2 0 (SUTURE)
SUT PLAIN 2 0 XLH (SUTURE) IMPLANT
SUT PLAIN ABS 2-0 CT1 27XMFL (SUTURE) IMPLANT
SUT VIC AB 0 CT1 36 (SUTURE) ×4 IMPLANT
SUT VIC AB 2-0 CT1 27 (SUTURE) ×2
SUT VIC AB 2-0 CT1 TAPERPNT 27 (SUTURE) ×1 IMPLANT
SUT VIC AB 4-0 PS2 27 (SUTURE) IMPLANT
SYR CONTROL 10ML LL (SYRINGE) ×2 IMPLANT
TOWEL OR 17X24 6PK STRL BLUE (TOWEL DISPOSABLE) ×2 IMPLANT
TRAY FOLEY BAG SILVER LF 14FR (SET/KITS/TRAYS/PACK) ×1 IMPLANT

## 2017-10-09 NOTE — Brief Op Note (Signed)
10/09/2017  9:28 AM  PATIENT:  Alysia PennaAshley C Kidane  31 y.o. female  PRE-OPERATIVE DIAGNOSIS:  Previous Cesarean Section x 2, term gestation  POST-OPERATIVE DIAGNOSIS:  Previous Cesarean Section x 2. Term gestation, transverse presentation  PROCEDURE:  Repeat Cesarean section, kerr hysterotomy  SURGEON:  Surgeon(s) and Role:    * Maxie Betterousins, Icela Glymph, MD - Primary  PHYSICIAN ASSISTANT:   ASSISTANTS: Arlan Organaniela Paul, CNM   ANESTHESIA:   spinal Findings: nl tubes and ovaries, thin LUS. Live female transverse back down EBL:  600 mL   BLOOD ADMINISTERED:none  DRAINS: none   LOCAL MEDICATIONS USED:  MARCAINE     SPECIMEN:  No Specimen  DISPOSITION OF SPECIMEN:  N/A  COUNTS:  YES  TOURNIQUET:  * No tourniquets in log *  DICTATION: .[id number]  PLAN OF CARE: Admit to inpatient   PATIENT DISPOSITION:  PACU - hemodynamically stable.   Delay start of Pharmacological VTE agent (>24hrs) due to surgical blood loss or risk of bleeding: no

## 2017-10-09 NOTE — Anesthesia Procedure Notes (Addendum)
Spinal  Patient location during procedure: OR Start time: 10/09/2017 8:27 AM End time: 10/09/2017 8:29 AM Staffing Anesthesiologist: Effie Berkshire, MD Performed: anesthesiologist  Preanesthetic Checklist Completed: patient identified, site marked, surgical consent, pre-op evaluation, timeout performed, IV checked, risks and benefits discussed and monitors and equipment checked Spinal Block Patient position: sitting Prep: Betadine Patient monitoring: heart rate, continuous pulse ox, blood pressure and cardiac monitor Approach: midline Location: L4-5 Injection technique: single-shot Needle Needle type: Introducer and Pencan  Needle gauge: 24 G Needle length: 9 cm Additional Notes Negative paresthesia. Negative blood return. Positive free-flowing CSF. Expiration date of kit checked and confirmed. Patient tolerated procedure well, without complications.

## 2017-10-09 NOTE — Anesthesia Postprocedure Evaluation (Signed)
Anesthesia Post Note  Patient: [name]  Procedure(s) Performed: Repeat CESAREAN SECTION (N/A Abdomen)     Patient location during evaluation: PACU Anesthesia Type: Spinal Level of consciousness: oriented and awake and alert Pain management: pain level controlled Vital Signs Assessment: post-procedure vital signs reviewed and stable Respiratory status: spontaneous breathing, respiratory function stable and patient connected to nasal cannula oxygen Cardiovascular status: blood pressure returned to baseline and stable Postop Assessment: no headache, no backache, no apparent nausea or vomiting, spinal receding and patient able to bend at knees Anesthetic complications: no    Last Vitals:  Vitals:   10/09/17 0950 10/09/17 1000  BP: (!) 104/55 (!) 101/49  Pulse: 79 81  Resp: 15 16  Temp: 36.4 C   SpO2: 100% 100%    Last Pain:  Vitals:   10/09/17 0950  TempSrc: Oral  PainSc:    Pain Goal:                 Shelton SilvasKevin D Rowen Hur

## 2017-10-09 NOTE — Consult Note (Signed)
Neonatology Note:   Attendance at C-section:    I was asked by Dr. Cherly Hensenousins to attend this repeat C/S at term. The mother is a G4P2A1 A neg, GBS neg with an uncomplicated prenatal course. ROM at delivery, fluid clear. Infant vigorous with good spontaneous cry and tone. Delayed cord clamping was done. Needed only minimal bulb suctioning. Ap 9/9. Lungs clear to ausc in DR. To CN to care of Pediatrician.   [name] Souhristie C. Conal Shetley, MD

## 2017-10-09 NOTE — Addendum Note (Signed)
Addendum  created 10/09/17 1318 by Shanon PayorGregory, Robt Okuda M, CRNA   Sign clinical note

## 2017-10-09 NOTE — Transfer of Care (Signed)
Immediate Anesthesia Transfer of Care Note  Patient: [name]  Procedure(s) Performed: Repeat CESAREAN SECTION (N/A Abdomen)  Patient Location: PACU  Anesthesia Type:Spinal  Level of Consciousness: awake, alert  and oriented  Airway & Oxygen Therapy: Patient Spontanous Breathing  Post-op Assessment: Report given to RN and Post -op Vital signs reviewed and stable  Post vital signs: Reviewed and stable  Last Vitals:  Vitals:   10/09/17 0548  BP: (!) 139/98  Pulse: 100  Resp: 18  Temp: 36.7 C    Last Pain:  Vitals:   10/09/17 0559  TempSrc:   PainSc: 0-No pain         Complications: No apparent anesthesia complications

## 2017-10-09 NOTE — Anesthesia Preprocedure Evaluation (Addendum)
Anesthesia Evaluation  Patient identified by MRN, date of birth, ID band Patient awake    Reviewed: Allergy & Precautions, NPO status , Patient's Chart, lab work & pertinent test results  Airway Mallampati: I  TM Distance: >3 FB Neck ROM: Full    Dental  (+) Teeth Intact   Pulmonary neg pulmonary ROS, former smoker,    breath sounds clear to auscultation       Cardiovascular negative cardio ROS   Rhythm:Regular Rate:Normal     Neuro/Psych negative neurological ROS     GI/Hepatic negative GI ROS, Neg liver ROS,   Endo/Other  negative endocrine ROS  Renal/GU negative Renal ROS     Musculoskeletal negative musculoskeletal ROS (+)   Abdominal   Peds  Hematology negative hematology ROS (+)   Anesthesia Other Findings Day of surgery medications reviewed with the patient.  Reproductive/Obstetrics (+) Pregnancy                            Lab Results  Component Value Date   WBC 5.3 10/08/2017   HGB 10.4 (L) 10/08/2017   HCT 31.8 (L) 10/08/2017   MCV 92.4 10/08/2017   PLT 205 10/08/2017   No results found for: INR, PROTIME   Anesthesia Physical Anesthesia Plan  ASA: II  Anesthesia Plan: Spinal   Post-op Pain Management:    Induction:   PONV Risk Score and Plan:   Airway Management Planned:   Additional Equipment:   Intra-op Plan:   Post-operative Plan:   Informed Consent: I have reviewed the patients History and Physical, chart, labs and discussed the procedure including the risks, benefits and alternatives for the proposed anesthesia with the patient or authorized representative who has indicated his/her understanding and acceptance.     Plan Discussed with:   Anesthesia Plan Comments:         Anesthesia Quick Evaluation

## 2017-10-09 NOTE — H&P (Signed)
[name] is a 31 y.o. female presenting for repeat C/S. PNC uncomplicated. OB History    Gravida Para Term Preterm AB Living   4 2 1 1 1 3    SAB TAB Ectopic Multiple Live Births   0 0 1 1 2      Past Medical History:  Diagnosis Date  . Chlamydia ?2007  . Heart murmur   . Urinary tract infection   . Vitamin D deficiency    Past Surgical History:  Procedure Laterality Date  . CESAREAN SECTION    . CESAREAN SECTION N/A 07/20/2012   [facility] Antionette CharJackson-Moore[facility]  . LAPAROSCOPY FOR ECTOPIC PREGNANCY  Feb 2012  . SALPINGECTOMY     Family History: family history includes Hypertension in her father. Social History:  reports that she quit smoking about 5 years ago. she has never used smokeless tobacco. She reports that she does not drink alcohol or use drugs.     Maternal Diabetes: No Genetic Screening: Declined Maternal Ultrasounds/Referrals: Abnormal:  Findings:   Isolated choroid plexus cyst Fetal Ultrasounds or other Referrals:  None Maternal Substance Abuse:  No Significant Maternal Medications:  None Significant Maternal Lab Results:  Lab values include: Group B Strep negative, Rh negative Other Comments:  None  Review of Systems  All other systems reviewed and are negative.  Maternal Medical History:  Fetal activity: Perceived fetal activity is normal.    Prenatal complications: no prenatal complications Prenatal Complications - Diabetes: none.    Blood pressure (!) 139/98, pulse 100, temperature 98.1 F (36.7 C), temperature source Oral, resp. rate 18, height 5\' 2"  (1.575 m), weight 73 kg (161 lb), last menstrual period 01/07/2017, currently breastfeeding. Exam Physical Exam  Constitutional: She is oriented to person, place, and time. She appears well-developed and well-nourished.  HENT:  Head: Atraumatic.  Eyes: EOM are normal.  Neck: Neck supple.  Cardiovascular: Regular rhythm.  Respiratory: Breath sounds normal.  GI: Soft.   Musculoskeletal: She exhibits no edema.  Neurological: She is alert and oriented to person, place, and time.  Skin: Skin is warm and dry.  Psychiatric: She has a normal mood and affect.    Prenatal labs: ABO, Rh: --/--/A NEG (11/16 0947) Antibody: NEG (11/16 0947) Rubella: Immune (05/08 0000) RPR: Non Reactive (11/16 1000)  HBsAg: Negative (05/08 0000)  HIV: Non-reactive (05/08 0000)  GBS: Negative (10/22 0000)  CBC    Component Value Date/Time   WBC 5.3 10/08/2017 1000   RBC 3.44 (L) 10/08/2017 1000   HGB 10.4 (L) 10/08/2017 1000   HCT 31.8 (L) 10/08/2017 1000   PLT 205 10/08/2017 1000   MCV 92.4 10/08/2017 1000   MCH 30.2 10/08/2017 1000   MCHC 32.7 10/08/2017 1000   RDW 13.8 10/08/2017 1000    Assessment/Plan: Previous C/S x 2 Term gestation Rh negative P) repeat C/S . Risk of surgery reviewed including infection, bleeding, injury to bladder, bowels, ureter, internal scar tissue, poss need for blood transfusion. ALL ? answered  [name] A [name] 10/09/2017, 7:30 AM

## 2017-10-09 NOTE — Lactation Note (Signed)
This note was copied from a baby's chart. Lactation Consultation Note  Patient Name: [name] NWGNF'AToday's Date: 10/09/2017 Reason for consult: Initial assessment;Term  P4 experienced BFing Mom, baby 649 hrs old.  Mom states baby breastfed well after birth, and has been sleepy every since. Baby STS on Mom's chest. Offered to assist with latch.  Baby not interested in latching.  Helped Mom hand express 3 ml of colostrum and spoon fed baby this.  Mom has copious colostrum easily expressed.  Encouraged her to continue to try to latch baby, and hand express and spoon feed often. Lactation brochure given to Mom.   Encouraged Mom to continue to keep baby STS, and offer breast on cue. To call prn for assistance. Consult Status Consult Status: Follow-up Date: 10/10/17 Follow-up type: In-patient    [name], [name] E 10/09/2017, 5:59 PM

## 2017-10-09 NOTE — Addendum Note (Signed)
Addendum  created 10/09/17 1119 by Shelton SilvasHollis, Kevin D, MD   Child order released for a procedure order, Intraprocedure Blocks edited, Intraprocedure Meds edited, Sign clinical note

## 2017-10-09 NOTE — Anesthesia Postprocedure Evaluation (Signed)
Anesthesia Post Note  Patient: [name]  Procedure(s) Performed: Repeat CESAREAN SECTION (N/A Abdomen)     Patient location during evaluation: Mother Baby Anesthesia Type: Spinal Level of consciousness: awake and alert and oriented Pain management: satisfactory to patient Vital Signs Assessment: post-procedure vital signs reviewed and stable Respiratory status: spontaneous breathing and nonlabored ventilation Cardiovascular status: stable Postop Assessment: no headache, no backache, patient able to bend at knees, no signs of nausea or vomiting and adequate PO intake Anesthetic complications: no    Last Vitals:  Vitals:   10/09/17 1125 10/09/17 1235  BP: (!) 121/56 (!) 115/59  Pulse: 80 70  Resp: 16 18  Temp: 36.8 C 36.6 C  SpO2: 100% 99%    Last Pain:  Vitals:   10/09/17 1235  TempSrc: Oral  PainSc:    Pain Goal:                 Roxie Gueye

## 2017-10-10 DIAGNOSIS — O9902 Anemia complicating childbirth: Secondary | ICD-10-CM | POA: Diagnosis present

## 2017-10-10 HISTORY — DX: Anemia complicating childbirth: O99.02

## 2017-10-10 LAB — CBC
HCT: 26.3 % — ABNORMAL LOW (ref 36.0–46.0)
HEMOGLOBIN: 8.8 g/dL — AB (ref 12.0–15.0)
MCH: 30.6 pg (ref 26.0–34.0)
MCHC: 33.5 g/dL (ref 30.0–36.0)
MCV: 91.3 fL (ref 78.0–100.0)
Platelets: 176 10*3/uL (ref 150–400)
RBC: 2.88 MIL/uL — AB (ref 3.87–5.11)
RDW: 13.7 % (ref 11.5–15.5)
WBC: 11.9 10*3/uL — ABNORMAL HIGH (ref 4.0–10.5)

## 2017-10-10 MED ORDER — POLYSACCHARIDE IRON COMPLEX 150 MG PO CAPS
150.0000 mg | ORAL_CAPSULE | Freq: Every day | ORAL | Status: DC
  Administered 2017-10-10 – 2017-10-11 (×2): 150 mg via ORAL
  Filled 2017-10-10 (×2): qty 1

## 2017-10-10 MED ORDER — MAGNESIUM OXIDE 400 (241.3 MG) MG PO TABS
400.0000 mg | ORAL_TABLET | Freq: Every day | ORAL | Status: DC
  Administered 2017-10-10 – 2017-10-11 (×2): 400 mg via ORAL
  Filled 2017-10-10 (×2): qty 1

## 2017-10-10 NOTE — Lactation Note (Signed)
This note was copied from a baby's chart. Lactation Consultation Note  Patient Name: [name] XBJYN'WToday's Date: 10/10/2017  Mom states baby becomes sleepy at breast.  She is pumping and spoon feeding.  Reviewed waking techniques and breast massage.  Instructed to call for assist prn.   Maternal Data    Feeding Feeding Type: Breast Fed Length of feed: 3 min  LATCH Score                   Interventions    Lactation Tools Discussed/Used     Consult Status      Huston FoleyMOULDEN, Arlyn Buerkle S 10/10/2017, 2:57 PM

## 2017-10-10 NOTE — Progress Notes (Signed)
Subjective: POD# 1 Information for the patient's newborn:  [name], [name] [[id number]][[id number]]  female   Reports feeling well, itchy. Feeding: breast Patient reports tolerating PO.  Breast symptoms: no difficulties. Pain controlled with PO meds Denies HA/SOB/C/P/N/V/dizziness. Flatus presenrt. She reports vaginal bleeding as normal, without clots.  She is ambulating, urinating without difficulty.     Objective:   VS:    Vitals:   10/10/17 0322 10/10/17 0501 10/10/17 0610 10/10/17 0814  BP:  108/66  (!) 95/57  Pulse:  79  82  Resp: 15 15 16 16   Temp:  98.4 F (36.9 C)  98.6 F (37 C)  TempSrc:  Oral  Oral  SpO2: 99% 100% 100% 100%  Weight:      Height:          Intake/Output Summary (Last 24 hours) at 10/10/2017 0935 Last data filed at 10/10/2017 0100 Gross per 24 hour  Intake 780 ml  Output 1550 ml  Net -770 ml        Recent Labs    10/08/17 1000 10/10/17 0546  WBC 5.3 11.9*  HGB 10.4* 8.8*  HCT 31.8* 26.3*  PLT 205 176     Blood type: --/--/A NEG (11/18 0546) / newborn Rh pos  Rubella: Immune (05/08 0000)     Physical Exam:  General: alert, cooperative and no distress CV: Regular rate and rhythm Resp: clear Abdomen: soft, nontender, normal bowel sounds Incision: serous drainage present, will change dressing today, incision intact, approximated w/ suture Uterine Fundus: firm, below umbilicus, nontender Lochia: minimal Ext: no edema, redness or tenderness in the calves or thighs      Assessment/Plan: 31 y.o.   POD# 1. [id number]                  Principal Problem:   Cesarean delivery delivered 11/17 Active Problems:   Postpartum care following cesarean delivery   Maternal anemia, with delivery  - start oral fe and Mag ox Rh neg, needs Rhogam Doing well, stable.               Advance diet as tolerated Encourage rest when baby rests Dressing change - hcd  Breastfeeding support Encourage to ambulate Routine post-op care  Neta Mendsaniela C Henny Strauch,  CNM, MSN 10/10/2017, 9:35 AM

## 2017-10-11 LAB — RH IG WORKUP (INCLUDES ABO/RH)
ABO/RH(D): A NEG
Fetal Screen: NEGATIVE
GESTATIONAL AGE(WKS): 39
Unit division: 0

## 2017-10-11 MED ORDER — OXYCODONE-ACETAMINOPHEN 5-325 MG PO TABS: 1.0000 | ORAL_TABLET | ORAL | 0 refills | Status: DC | PRN

## 2017-10-11 MED ORDER — IBUPROFEN 600 MG PO TABS: 600.0000 mg | ORAL_TABLET | Freq: Four times a day (QID) | ORAL | 0 refills | Status: DC

## 2017-10-11 MED ORDER — MAGNESIUM OXIDE 400 (241.3 MG) MG PO TABS: 400.0000 mg | ORAL_TABLET | Freq: Every day | ORAL | 0 refills | Status: DC

## 2017-10-11 MED ORDER — POLYSACCHARIDE IRON COMPLEX 150 MG PO CAPS: 150.0000 mg | ORAL_CAPSULE | Freq: Every day | ORAL | 0 refills | Status: DC

## 2017-10-11 NOTE — Progress Notes (Signed)
POSTOPERATIVE DAY # 2 S/P CS   S:         Reports feeling well - wants to go home             Tolerating po intake / no nausea / no vomiting / + flatus / + BM             Bleeding is light             Pain controlled with motrin and oxycodone             Up ad lib / ambulatory/ voiding QS  Newborn breast feeding   O:  VS: BP 103/62 (BP Location: Right Arm)   Pulse 76   Temp 98.3 F (36.8 C) (Oral)   Resp 20   Ht 5\' 2"  (1.575 m)   Wt 73 kg (161 lb)   LMP 01/07/2017 (Approximate)   SpO2 100%   Breastfeeding? Yes   BMI 29.45 kg/m    LABS:               Recent Labs    10/08/17 1000 10/10/17 0546  WBC 5.3 11.9*  HGB 10.4* 8.8*  PLT 205 176               Bloodtype: --/--/A NEG (11/18 0546) Nils Flack/newborn RH positive -  Rhogam given   Rubella: Immune (05/08 0000)                                 Physical Exam:             Alert and Oriented X3  Lungs: Clear and unlabored  Heart: regular rate and rhythm / no mumurs  Abdomen: soft, non-tender, non-distended, active BS             Fundus: firm, non-tender, U-1             Dressing intact              Incision:  approximated with suture / no erythema / no ecchymosis / no drainage  Perineum: intact  Lochia: light   Extremities: no edema, no calf pain or tenderness, negative Homans  A:        POD # 2 S/P CS            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

## 2017-10-11 NOTE — Discharge Summary (Signed)
OB Discharge Summary  Patient Name: [name] DOB: 09/17/1986 MRN: [id number]  Date of admission: 10/09/2017  Admitting diagnosis: Previous Cesarean Section x 2 Intrauterine pregnancy: 543w4d     Secondary diagnosis: Anemia   Date of discharge: 10/11/2017    Discharge diagnosis: Term Pregnancy Delivered      Prenatal history: [id number]   EDC : 10/14/2017, by Last Menstrual Period  [facility]  Primary provider : Cousins Prenatal course complicated by previous CS x 2 / IDA / RH negative  Prenatal Labs: ABO, Rh: --/--/A NEG (11/18 0546) / Rhophylac given Antibody: NEG (11/16 0947) Rubella: Immune (05/08 0000)   RPR: Non Reactive (11/16 1000)  HBsAg: Negative (05/08 0000)  HIV: Non-reactive (05/08 0000)  GBS: Negative (10/22 0000)                                    Hospital course:  Sceduled C/S   31 y.o. yo [id number] at 8343w4d was admitted to the hospital 10/09/2017 for scheduled cesarean section with the following indication:Elective Repeat.  Membrane Rupture Time/Date: 8:54 AM ,10/09/2017   Patient delivered a Viable infant.10/09/2017  Details of operation can be found in separate operative note.  Pateint had an uncomplicated postpartum course.  She is ambulating, tolerating a regular diet, passing flatus, and urinating well. Patient is discharged home in stable condition on  10/11/17        Delivering PROVIDER: COUSINS, SHERONETTE                                                            Complications: None  Newborn Data: Live born female  Birth Weight: 6 lb 15.1 oz (3150 g) APGAR: 9, 9  Newborn Delivery   Birth date/time:  10/09/2017 08:55:00 Delivery type:  C-Section, Low Transverse C-section categorization:  Repeat     Baby Feeding: Breast Disposition:home with mother  Post partum procedures:rhogam  Labs: Lab Results  Component Value Date   WBC 11.9 (H) 10/10/2017   HGB 8.8 (L) 10/10/2017   HCT 26.3 (L)  10/10/2017   MCV 91.3 10/10/2017   PLT 176 10/10/2017   CMP Latest Ref Rng & Units 12/25/2011  Glucose 70 - 99 mg/dL 81  BUN 6 - 23 mg/dL 6  Creatinine 0.860.50 - 5.781.10 mg/dL 4.69(G0.47(L)  Sodium 295135 - 284145 mEq/L 132(L)  Potassium 3.5 - 5.1 mEq/L 4.0  Chloride 96 - 112 mEq/L 102  CO2 19 - 32 mEq/L 23  Calcium 8.4 - 10.5 mg/dL 8.7  Total Protein 6.0 - 8.3 g/dL 6.6  Total Bilirubin 0.3 - 1.2 mg/dL 0.3  Alkaline Phos 39 - 117 U/L 39  AST 0 - 37 U/L 11  ALT 0 - 35 U/L 5      Physical Exam @ time of discharge:  Vitals:   10/10/17 1554 10/10/17 1920 10/10/17 2334 10/11/17 0815  BP: (!) 102/47 93/63 (!) 101/59 103/62  Pulse: 88 71 91 76  Resp: 16 15 18 20   Temp: 98.4 F (36.9 C) 98.1 F (36.7 C) 98 F (36.7 C) 98.3 F (36.8 C)  TempSrc: Oral Oral Oral Oral  SpO2: 100% 99% 100% 100%  Weight:  Height:        General: alert, cooperative and no distress Lochia: appropriate Uterine Fundus: firm Perineum: intact Incision: Healing well with no significant drainage Extremities: DVT Evaluation: No evidence of DVT seen on physical exam.   Discharge instructions:  "Baby and Me Booklet" and [facility] Booklet  Discharge Medications:  Allergies as of 10/11/2017   No Known Allergies     Medication List    STOP taking these medications   diphenhydramine-acetaminophen 25-500 MG Tabs tablet Commonly known as:  TYLENOL PM   guaiFENesin 100 MG/5ML liquid Commonly known as:  ROBITUSSIN     TAKE these medications   ibuprofen 600 MG tablet Commonly known as:  ADVIL,MOTRIN Take 1 tablet (600 mg total) every 6 (six) hours by mouth.   iron polysaccharides 150 MG capsule Commonly known as:  NIFEREX Take 1 capsule (150 mg total) daily by mouth.   magnesium oxide 400 (241.3 Mg) MG tablet Commonly known as:  MAG-OX Take 1 tablet (400 mg total) daily by mouth.   oxyCODONE-acetaminophen 5-325 MG tablet Commonly known as:  PERCOCET/ROXICET Take 1 tablet every 4 (four) hours as needed by  mouth (pain scale 4-7).            Discharge Care Instructions  (From admission, onward)        Start     Ordered   10/11/17 0000  Discharge wound care:    Comments:  Keep incision clean and dry - remove plastic dressing in 4 days and tape strips in 2 weeks   10/11/17 0938      Diet: routine diet  Activity: Advance as tolerated. Pelvic rest x 6 weeks.   Follow up:6 weeks    Signed: Marlinda MikeBAILEY, Johathan Province CNM, MSN, [facility] 10/11/2017, 9:38 AM

## 2017-10-11 NOTE — Lactation Note (Signed)
This note was copied from a baby's chart. Lactation Consultation Note  Patient Name: [name] WUJWJ'XToday's Date: 10/11/2017 Reason for consult: Follow-up assessment;Nipple pain/trauma   Follow up with mom of 49 hour old infant . Infant with 5 BF for 10-25 minutes, EBM x 3 with spoon of 5-15 cc, 5 voids and 8 stools in last 24 hours. Infant weight 6 lb 7.9 oz with weight loss of 6% since birth.   Mom reports some pain with latch and some throughout feedings. We awakened infant and mom latched infant in the cradle hold. Enc mom to use the cross cradle hold to latch and to flange lips as needed. Mom denied pain after we got the lips flanged. Infant fed actively for 10 minutes with a lot of swallows and fell asleep. Infant did soften breast post BF. Engorgement treatment for the nursing mother handout given and explained. Mom is noted to be very full and firm.   Reviewed supply and demand and pumping for comfort to prevent over supply. Mom reports oversupply with last child. Discussed importance of emptying one breast before offering second breast to infant with each feeding. Discussed prepumping to soften areola as needed and post pumping for comfort.   Mom aware of LC phone #, OP services, and BF Support Groups.   Reviewed I/O, Engorgement prevention/treatment, and breast milk expression and storage.  Enc mom to call with any questions/concerns as needed.       Maternal Data Formula Feeding for Exclusion: No Has patient been taught Hand Expression?: Yes Does the patient have breastfeeding experience prior to this delivery?: Yes  Feeding Feeding Type: Breast Fed Length of feed: 10 min  LATCH Score Latch: Grasps breast easily, tongue down, lips flanged, rhythmical sucking.  Audible Swallowing: Spontaneous and intermittent  Type of Nipple: Everted at rest and after stimulation  Comfort (Breast/Nipple): Filling, red/small blisters or bruises, mild/mod discomfort  Hold  (Positioning): Assistance needed to correctly position infant at breast and maintain latch.  LATCH Score: 8  Interventions Interventions: Breast feeding basics reviewed;Adjust position;Assisted with latch;Support pillows;Skin to skin;Position options;Breast massage;Breast compression;Pre-pump if needed;Hand express  Lactation Tools Discussed/Used     Consult Status Consult Status: Complete Follow-up type: Call as needed    Ed BlalockSharon S Hice 10/11/2017, 10:34 AM

## 2017-10-11 NOTE — Op Note (Signed)
NAME:  [name], [name]                  ACCOUNT NO.:  MEDICAL RECORD NO.:  [id number]  LOCATION:                                 FACILITY:  PHYSICIAN:  Maxie BetterSheronette Jozsef Wescoat, M.D.    DATE OF BIRTH:  DATE OF PROCEDURE:  10/09/2017 DATE OF DISCHARGE:                              OPERATIVE REPORT   PREOPERATIVE DIAGNOSES:  Previous cesarean section x2, term gestation.  POSTOPERATIVE DIAGNOSES:  Previous cesarean section x2, term gestation.  PROCEDURES:  Repeat cesarean section, Kerr hysterotomy.  ANESTHESIA:  Spinal.  SURGEON:  Maxie BetterSheronette Corbin Falck, MD.  ASSISTANT:  Arlan Organaniela Paul, CNM.  DESCRIPTION OF PROCEDURE:  Under adequate spinal anesthesia, the patient was placed in the supine position with a left lateral tilt.  She was sterilely prepped and draped in usual fashion.  An indwelling Foley catheter was sterilely placed.  0.25% Marcaine was injected along the previous Pfannenstiel skin incision site.  Pfannenstiel skin incision was made, carried down to the rectus fascia.  The rectus fascia was then opened transversely.  The rectus fascia was then sharply dissected off the rectus muscle in a superior fashion.  The lower portion of the fascia was right on the pubic bone and therefore it was not detached. Sharp dissection was used to split the rectus muscle in the midline. The parietal peritoneum was then entered bluntly and further extended superiorly and inferiorly once entry into the abdomen was noted.  With careful dissection, the rectus fascia off the rectus muscle was continued superiorly as well and the parietal peritoneum incision was extended superiorly.  A self-retaining Alexis retractor was then placed. A thin lower uterine segment was noted.  Bladder was adherent to lower uterine segment.  A curvilinear low transverse uterine incision was made to open the vesicouterine peritoneum vessel.  Incision then was extended with bandage scissors.  Artificial rupture of membranes  then was done. A back-down oblique transverse position was noted of the fetus. Decision was then made based on the exploration of the uterine cavity to deliver by breech maneuver.  The baby's right leg was brought into the field.  The left one was extended superiorly and which was ultimately had to be brought down as well with subsequent delivery of a live female in the usual breech maneuvers thereafter.  Delayed cord clamp.  The baby was bulb suctioned in the abdomen.  Cord was subsequently clamped, cut. The baby was transferred to the awaiting pediatricians, who assigned Apgars of 9 and 9 at 1 and 5 minutes.  The placenta was spontaneous intact, not sent to Pathology.  Uterine cavity was cleaned of debris. Uterine incision had no extensions.  The lower uterine segment was thin. The uterine incision was closed with one layer of 0 Monocryl running lock stitch, though it was not enough there to do a second layer closure. Normal tubes and ovaries were noted bilaterally.  The abdomen was irrigated and suctioned of debris.  The Interceed was placed over the lower uterine segment and the Alexis retractor removed.  The parietal peritoneum was then closed with 2-0 Vicryl.  The rectus fascia was inspected.  No rectus facial defect was noted.  The rectus muscle was  approximated with interrupted sutures and the rectus fascia was closed with 0 Vicryl x2.  The subcutaneous area was irrigated.  Small bleeders cauterized. Interrupted 2-0 plain sutures placed and the skin was approximated using 4-0 Vicryl subcuticular closure.  Steri-Strips and benzoin were placed.  SPECIMEN:  Placenta not sent to Pathology.  ESTIMATED BLOOD LOSS:  600 mL.  INTRAOPERATIVE FLUID:  2100 mL.  URINE OUTPUT:  100 mL.  COUNTS:  Sponge and instrument counts x2 were correct.  COMPLICATION:  None.  The patient tolerated the procedure well, was transferred to recovery room in stable condition.     Maxie BetterSheronette Jamina Macbeth,  M.D.     [place]kinson/MEDQ  D:  10/09/2017  T:  10/09/2017  Job:  [id number]

## 2017-12-07 DIAGNOSIS — Z130 Encounter for screening for diseases of the blood and blood-forming organs and certain disorders involving the immune mechanism: Secondary | ICD-10-CM | POA: Diagnosis not present

## 2017-12-07 DIAGNOSIS — Z124 Encounter for screening for malignant neoplasm of cervix: Secondary | ICD-10-CM | POA: Diagnosis not present

## 2017-12-07 DIAGNOSIS — Z1151 Encounter for screening for human papillomavirus (HPV): Secondary | ICD-10-CM | POA: Diagnosis not present

## 2018-01-06 ENCOUNTER — Encounter (HOSPITAL_COMMUNITY): Payer: Self-pay

## 2020-08-15 DIAGNOSIS — R05 Cough: Secondary | ICD-10-CM | POA: Diagnosis not present

## 2021-01-30 ENCOUNTER — Ambulatory Visit (INDEPENDENT_AMBULATORY_CARE_PROVIDER_SITE_OTHER): Payer: 59 | Admitting: Podiatry

## 2021-01-30 ENCOUNTER — Other Ambulatory Visit: Payer: Self-pay

## 2021-01-30 DIAGNOSIS — L853 Xerosis cutis: Secondary | ICD-10-CM

## 2021-01-30 DIAGNOSIS — B351 Tinea unguium: Secondary | ICD-10-CM

## 2021-01-30 DIAGNOSIS — Z79899 Other long term (current) drug therapy: Secondary | ICD-10-CM | POA: Diagnosis not present

## 2021-01-30 MED ORDER — FLUCONAZOLE 150 MG PO TABS: 150.0000 mg | ORAL_TABLET | ORAL | 0 refills | Status: DC

## 2021-01-30 MED ORDER — AMMONIUM LACTATE 12 % EX CREA: TOPICAL_CREAM | CUTANEOUS | 0 refills | Status: DC | PRN

## 2021-01-30 NOTE — Patient Instructions (Signed)
Fluconazole tablets What is this medicine? FLUCONAZOLE (floo KON na zole) is an antifungal medicine. It is used to treat certain kinds of fungal or yeast infections. This medicine may be used for other purposes; ask your health care provider or pharmacist if you have questions. COMMON BRAND NAME(S): Diflucan What should I tell my health care provider before I take this medicine? They need to know if you have any of these conditions:  irregular heartbeat or rhythm  kidney disease  liver disease  low levels of potassium in the blood  an unusual or allergic reaction to fluconazole, other azole antifungals, medicines, foods, dyes, or preservatives  pregnant or trying to get pregnant  breast-feeding How should I use this medicine? Take this medicine by mouth. Follow the directions on the prescription label. Do not take your medicine more often than directed. Talk to your pediatrician regarding the use of this medicine in children. Special care may be needed. This medicine has been used in children as young as 6 months of age. Overdosage: If you think you have taken too much of this medicine contact a poison control center or emergency room at once. NOTE: This medicine is only for you. Do not share this medicine with others. What if I miss a dose? If you miss a dose, take it as soon as you can. If it is almost time for your next dose, take only that dose. Do not take double or extra doses. What may interact with this medicine? Do not take this medicine with any of the following medications:  flibanserin  lomitapide  lonafarnib  other medicines that prolong the QT interval (cause an abnormal heart rhythm)  triazolam This medicine may also interact with the following medications:  certain antibiotics like rifabutin, rifampin  certain antivirals for HIV or hepatitis  certain medicines for blood pressure, heart disease, irregular heartbeat  certain medicines for cholesterol like  atorvastatin, lovastatin, and simvastatin  certain medicines for depression, like amitriptyline, nortriptyline  certain medicines for diabetes like glipizide or glyburide  certain medicines for seizures like carbamazepine, phenytoin  certain medicines that treat or prevent blood clots like warfarin  certain narcotic medicines for pain like alfentanil, fentanyl, methadone  cyclophosphamide  cyclosporine  ibrutinib  lemborexant  midazolam  NSAIDS, medicines for pain and inflammation, like ibuprofen or naproxen  olaparib  sirolimus  steroid medicines like prednisone  tacrolimus  theophylline  tofacitinib  tolvaptan  vinblastine  vincristine  vitamin A  voriconazole This list may not describe all possible interactions. Give your health care provider a list of all the medicines, herbs, non-prescription drugs, or dietary supplements you use. Also tell them if you smoke, drink alcohol, or use illegal drugs. Some items may interact with your medicine. What should I watch for while using this medicine? Visit your doctor or health care professional for regular checkups. If you are taking this medicine for a long time you may need blood work. Tell your doctor if your symptoms do not improve. Some fungal infections need many weeks or months of treatment to cure. Alcohol can increase possible damage to your liver. Avoid alcoholic drinks. If you have a vaginal infection, do not have sex until you have finished your treatment. You can wear a sanitary napkin. Do not use tampons. Wear freshly washed cotton, not synthetic, panties. What side effects may I notice from receiving this medicine? Side effects that you should report to your doctor or health care professional as soon as possible:  allergic reactions like   skin rash or itching, hives, swelling of the lips, mouth, tongue, or throat  dark urine  feeling dizzy or faint  irregular heartbeat or chest pain  redness,  blistering, peeling or loosening of the skin, including inside the mouth  trouble breathing  unusual bruising or bleeding  vomiting  yellowing of the eyes or skin Side effects that usually do not require medical attention (report to your doctor or health care professional if they continue or are bothersome):  changes in how food tastes  diarrhea  headache  stomach upset or nausea This list may not describe all possible side effects. Call your doctor for medical advice about side effects. You may report side effects to FDA at 1-800-FDA-1088. Where should I keep my medicine? Keep out of the reach of children. Store at room temperature below 30 degrees C (86 degrees F). Throw away any medicine after the expiration date. NOTE: This sheet is a summary. It may not cover all possible information. If you have questions about this medicine, talk to your doctor, pharmacist, or health care provider.  2021 Elsevier/Gold Standard (2020-09-18 08:51:42)  

## 2021-01-31 ENCOUNTER — Other Ambulatory Visit: Payer: Self-pay | Admitting: Podiatry

## 2021-01-31 LAB — CBC WITH DIFFERENTIAL/PLATELET
Absolute Monocytes: 672 cells/uL (ref 200–950)
Basophils Absolute: 13 cells/uL (ref 0–200)
Basophils Relative: 0.2 %
Eosinophils Absolute: 83 cells/uL (ref 15–500)
Eosinophils Relative: 1.3 %
HCT: 39.4 % (ref 35.0–45.0)
Hemoglobin: 13.4 g/dL (ref 11.7–15.5)
Lymphs Abs: 2694 cells/uL (ref 850–3900)
MCH: 31.2 pg (ref 27.0–33.0)
MCHC: 34 g/dL (ref 32.0–36.0)
MCV: 91.8 fL (ref 80.0–100.0)
MPV: 11.2 fL (ref 7.5–12.5)
Monocytes Relative: 10.5 %
Neutro Abs: 2938 cells/uL (ref 1500–7800)
Neutrophils Relative %: 45.9 %
Platelets: 320 10*3/uL (ref 140–400)
RBC: 4.29 10*6/uL (ref 3.80–5.10)
RDW: 11.7 % (ref 11.0–15.0)
Total Lymphocyte: 42.1 %
WBC: 6.4 10*3/uL (ref 3.8–10.8)

## 2021-01-31 LAB — HEPATIC FUNCTION PANEL
AG Ratio: 1.5 (calc) (ref 1.0–2.5)
ALT: 6 U/L (ref 6–29)
AST: 11 U/L (ref 10–30)
Albumin: 4.4 g/dL (ref 3.6–5.1)
Alkaline phosphatase (APISO): 72 U/L (ref 31–125)
Bilirubin, Direct: 0.1 mg/dL (ref 0.0–0.2)
Globulin: 3 g/dL (calc) (ref 1.9–3.7)
Indirect Bilirubin: 0.4 mg/dL (calc) (ref 0.2–1.2)
Total Bilirubin: 0.5 mg/dL (ref 0.2–1.2)
Total Protein: 7.4 g/dL (ref 6.1–8.1)

## 2021-01-31 NOTE — Progress Notes (Signed)
Error

## 2021-02-03 NOTE — Progress Notes (Signed)
Subjective:   Patient ID: [name], female   DOB: 35 y.o.   MRN: [id number]   HPI 35 year old female presents the office today for concerns of dry skin on the left foot as well as for toenail thickening, discoloration.  The toenail issues as well as the dry skin has been ongoing for about 14 years.  She tried over-the-counter lotions and creams which has not been helpful.  Denies any open sores or any drainage.  No skin fissures.  No other concerns today.   Review of Systems  All other systems reviewed and are negative.  Past Medical History:  Diagnosis Date  . Chlamydia ?2007  . Heart murmur   . Urinary tract infection   . Vitamin D deficiency     Past Surgical History:  Procedure Laterality Date  . CESAREAN SECTION    . CESAREAN SECTION  07/20/2012   Procedure: CESAREAN SECTION;  Surgeon: Antionette Char, MD;  Location: [facility];  Service: Gynecology;  Laterality: N/A;  . CESAREAN SECTION N/A 10/09/2017   Procedure: Repeat CESAREAN SECTION;  Surgeon: Maxie Better, MD;  Location: [facility];  Service: Obstetrics;  Laterality: N/A;  EDD: 10/14/17  . LAPAROSCOPY FOR ECTOPIC PREGNANCY  Feb 2012  . SALPINGECTOMY       Current Outpatient Medications:  .  ammonium lactate (AMLACTIN) 12 % cream, Apply topically as needed for dry skin., Disp: 385 g, Rfl: 0 .  fluconazole (DIFLUCAN) 150 MG tablet, Take 1 tablet (150 mg total) by mouth once a week., Disp: 12 tablet, Rfl: 0 .  ibuprofen (ADVIL,MOTRIN) 600 MG tablet, Take 1 tablet (600 mg total) every 6 (six) hours by mouth., Disp: 30 tablet, Rfl: 0 .  iron polysaccharides (NIFEREX) 150 MG capsule, Take 1 capsule (150 mg total) daily by mouth., Disp: 45 capsule, Rfl: 0 .  magnesium oxide (MAG-OX) 400 (241.3 Mg) MG tablet, Take 1 tablet (400 mg total) daily by mouth., Disp: 45 tablet, Rfl: 0 .  oxyCODONE-acetaminophen (PERCOCET/ROXICET) 5-325 MG tablet, Take 1 tablet every 4 (four) hours as needed by mouth (pain scale  4-7)., Disp: 30 tablet, Rfl: 0  No Known Allergies       Objective:  Physical Exam  General: AAO x3, NAD  Dermatological: Dry, scaly, skin present in the plantar aspect the left foot.  Nails are hypertrophic, dystrophic with yellow-brown discoloration bilaterally.  There is no edema, erythema, drainage or pus or any signs of infection of the toenail sites.  There is no open lesions.  Vascular: Dorsalis Pedis artery and Posterior Tibial artery pedal pulses are 2/4 bilateral with immedate capillary fill time.There is no pain with calf compression, swelling, warmth, erythema.   Neruologic: Grossly intact via light touch bilateral.  Musculoskeletal: Muscular strength 5/5 in all groups tested bilateral.  Gait: Unassisted, Nonantalgic.       Assessment:   Onychomycosis, psoriasis     Plan:  -Treatment options discussed including all alternatives, risks, and complications -Etiology of symptoms were discussed -Advised the nail fungus we discussed options including oral, topical, alternative treatments for nail fungus.  After discussion looks to proceed with fluconazole.  Discussed side effects, success rates as well as duration of treatment.  I will check a CBC as well as a liver function prior to this starting this. -Also prescribed AmLactin.  Vivi Barrack DPM

## 2021-04-19 ENCOUNTER — Other Ambulatory Visit: Payer: Self-pay

## 2021-04-19 ENCOUNTER — Ambulatory Visit
Admission: EM | Admit: 2021-04-19 | Discharge: 2021-04-19 | Disposition: A | Discharge status: Home / Self Care | Payer: BC Managed Care – PPO | Attending: Emergency Medicine | Admitting: Emergency Medicine

## 2021-04-19 ENCOUNTER — Encounter: Payer: Self-pay | Admitting: Emergency Medicine

## 2021-04-19 DIAGNOSIS — R059 Cough, unspecified: Secondary | ICD-10-CM

## 2021-04-19 DIAGNOSIS — J22 Unspecified acute lower respiratory infection: Secondary | ICD-10-CM

## 2021-04-19 MED ORDER — PROMETHAZINE-DM 6.25-15 MG/5ML PO SYRP: 5.0000 mL | ORAL_SOLUTION | Freq: Four times a day (QID) | ORAL | 0 refills | Status: DC | PRN

## 2021-04-19 MED ORDER — AZITHROMYCIN 250 MG PO TABS: ORAL_TABLET | ORAL | 0 refills | Status: DC

## 2021-04-19 MED ORDER — CETIRIZINE HCL 10 MG PO CAPS: 10.0000 mg | ORAL_CAPSULE | Freq: Every day | ORAL | 0 refills | Status: DC

## 2021-04-19 MED ORDER — BENZONATATE 200 MG PO CAPS: 200.0000 mg | ORAL_CAPSULE | Freq: Three times a day (TID) | ORAL | 0 refills | Status: AC | PRN

## 2021-04-19 NOTE — Discharge Instructions (Signed)
Begin course of azithromycin Tessalon for daytime cough Phenergan DM for cough at home/bedtime-may cause drowsiness Begin daily cetirizine/Zyrtec or loratadine/Claritin to help with postnasal drainage May also use over-the-counter Mucinex Rest and fluids Follow-up if not improving or worsening

## 2021-04-19 NOTE — ED Triage Notes (Signed)
Pt sts cough from post nasal drip x 1 week with some post tussive vomiting; pt sts neg covid testing

## 2021-04-19 NOTE — ED Provider Notes (Signed)
[facility]    CSN: [id number] Arrival date & time: 04/19/21  4585      History   Chief Complaint Chief Complaint  Patient presents with  . Cough    HPI [name] is a 35 y.o. female presenting today for evaluation of cough.  Reports cough and postnasal drainage x1 week.  Reports cough is led to episodes of posttussive emesis.  Reports prior COVID testing negative.  Denies significant sinus symptoms.  Denies history of any lung problems.  HPI  Past Medical History:  Diagnosis Date  . Chlamydia ?2007  . Heart murmur   . Urinary tract infection   . Vitamin D deficiency     Patient Active Problem List   Diagnosis Date Noted  . Maternal anemia, with delivery 10/10/2017  . Postpartum care following cesarean delivery (11/17) 10/09/2017  . Cesarean delivery delivered 11/17 07/22/2012    Past Surgical History:  Procedure Laterality Date  . CESAREAN SECTION    . CESAREAN SECTION  07/20/2012   Procedure: CESAREAN SECTION;  Surgeon: Antionette Char, MD;  Location: [facility];  Service: Gynecology;  Laterality: N/A;  . CESAREAN SECTION N/A 10/09/2017   Procedure: Repeat CESAREAN SECTION;  Surgeon: Maxie Better, MD;  Location: [facility]TH[facility] SU[facility];  Service: Obstetrics;  Laterality: N/A;  EDD: 10/14/17  . LAPAROSCOPY FOR ECTOPIC PREGNANCY  Feb 2012  . SALPINGECTOMY      OB History    Gravida  4   Para  2   Term  1   Preterm  1   AB  1   Living  3     SAB  0   IAB  0   Ectopic  1   Multiple  1   Live Births  2            Home Medications    Prior to Admission medications   Medication Sig Start Date End Date Taking? Authorizing Provider  azithromycin (ZITHROMAX) 250 MG tablet Take first 2 tablets together, then 1 every day until finished. 04/19/21  Yes Michelyn Scullin C, PA-C  benzonatate (TESSALON) 200 MG capsule Take 1 capsule (200 mg total) by mouth 3 (three) times daily as needed for up to 7 days for cough. 04/19/21 04/26/21  Yes Veora Fonte C, PA-C  Cetirizine HCl 10 MG CAPS Take 1 capsule (10 mg total) by mouth daily for 10 days. 04/19/21 04/29/21 Yes Luken Shadowens C, PA-C  promethazine-dextromethorphan (PROMETHAZINE-DM) 6.25-15 MG/5ML syrup Take 5 mLs by mouth 4 (four) times daily as needed for cough. 04/19/21  Yes Monseratt Ledin C, PA-C  ammonium lactate (AMLACTIN) 12 % cream Apply topically as needed for dry skin. 01/30/21   Vivi Barrack, DPM  fluconazole (DIFLUCAN) 150 MG tablet Take 1 tablet (150 mg total) by mouth once a week. Patient not taking: Reported on 04/19/2021 01/30/21   Vivi Barrack, DPM  ibuprofen (ADVIL,MOTRIN) 600 MG tablet Take 1 tablet (600 mg total) every 6 (six) hours by mouth. 10/11/17   Marlinda Mike, CNM  iron polysaccharides (NIFEREX) 150 MG capsule Take 1 capsule (150 mg total) daily by mouth. 10/11/17   Marlinda Mike, CNM  magnesium oxide (MAG-OX) 400 (241.3 Mg) MG tablet Take 1 tablet (400 mg total) daily by mouth. 10/11/17   Marlinda Mike, CNM  oxyCODONE-acetaminophen (PERCOCET/ROXICET) 5-325 MG tablet Take 1 tablet every 4 (four) hours as needed by mouth (pain scale 4-7). Patient not taking: Reported on 04/19/2021 10/11/17   Marlinda Mike, CNM  Family History Family History  Problem Relation Age of Onset  . Hypertension Father   . Anesthesia problems Neg Hx   . Hypotension Neg Hx   . Malignant hyperthermia Neg Hx   . Pseudochol deficiency Neg Hx   . Other Neg Hx     Social History Social History   Tobacco Use  . Smoking status: Former Smoker    Quit date: 12/06/2011    Years since quitting: 9.3  . Smokeless tobacco: Never Used  Substance Use Topics  . Alcohol use: No  . Drug use: No     Allergies   Patient has no known allergies.   Review of Systems Review of Systems  Constitutional: Negative for activity change, appetite change, chills, fatigue and fever.  HENT: Positive for congestion. Negative for ear pain, rhinorrhea, sinus pressure, sore  throat and trouble swallowing.   Eyes: Negative for discharge and redness.  Respiratory: Positive for cough. Negative for chest tightness and shortness of breath.   Cardiovascular: Negative for chest pain.  Gastrointestinal: Negative for abdominal pain, diarrhea, nausea and vomiting.  Musculoskeletal: Negative for myalgias.  Skin: Negative for rash.  Neurological: Negative for dizziness, light-headedness and headaches.     Physical Exam Triage Vital Signs ED Triage Vitals  Enc Vitals Group     BP      Pulse      Resp      Temp      Temp src      SpO2      Weight      Height      Head Circumference      Peak Flow      Pain Score      Pain Loc      Pain Edu?      Excl. in GC?    No data found.  Updated Vital Signs BP 121/64 (BP Location: Left Arm)   Pulse 99   Temp 98 F (36.7 C) (Oral)   Resp 18   SpO2 95%   Visual Acuity Right Eye Distance:   Left Eye Distance:   Bilateral Distance:    Right Eye Near:   Left Eye Near:    Bilateral Near:     Physical Exam Vitals and nursing note reviewed.  Constitutional:      Appearance: She is well-developed.     Comments: No acute distress  HENT:     Head: Normocephalic and atraumatic.     Ears:     Comments: Bilateral ears without tenderness to palpation of external auricle, tragus and mastoid, EAC's without erythema or swelling, TM's with good bony landmarks and cone of light. Non erythematous.     Nose: Nose normal.     Mouth/Throat:     Comments: Oral mucosa pink and moist, no tonsillar enlargement or exudate. Posterior pharynx patent and nonerythematous, no uvula deviation or swelling. Normal phonation. Eyes:     Conjunctiva/sclera: Conjunctivae normal.  Cardiovascular:     Rate and Rhythm: Normal rate.  Pulmonary:     Effort: Pulmonary effort is normal. No respiratory distress.     Comments: Breathing comfortably at rest, CTABL, no wheezing, rales or other adventitious sounds auscultated Abdominal:      General: There is no distension.  Musculoskeletal:        General: Normal range of motion.     Cervical back: Neck supple.  Skin:    General: Skin is warm and dry.  Neurological:     Mental Status: She  is alert and oriented to person, place, and time.      UC Treatments / Results  Labs (all labs ordered are listed, but only abnormal results are displayed) Labs Reviewed - No data to display  EKG   Radiology No results found.  Procedures Procedures (including critical care time)  Medications Ordered in UC Medications - No data to display  Initial Impression / Assessment and Plan / UC Course  I have reviewed the triage vital signs and the nursing notes.  Pertinent labs & imaging results that were available during my care of the patient were reviewed by me and considered in my medical decision making (see chart for details).     Cough and drainage x1 week- covering with azithromycin given length of symptoms, Tessalon for daytime cough, Phenergan DM for nighttime, also recommended treatment of congestion with daily antihistamine or Mucinex.  Discussed strict return precautions. Patient verbalized understanding and is agreeable with plan.  Final Clinical Impressions(s) / UC Diagnoses   Final diagnoses:  Lower respiratory infection (e.g., bronchitis, pneumonia, pneumonitis, pulmonitis)  Cough     Discharge Instructions     Begin course of azithromycin Tessalon for daytime cough Phenergan DM for cough at home/bedtime-may cause drowsiness Begin daily cetirizine/Zyrtec or loratadine/Claritin to help with postnasal drainage May also use over-the-counter Mucinex Rest and fluids Follow-up if not improving or worsening    ED Prescriptions    Medication Sig Dispense Auth. Provider   azithromycin (ZITHROMAX) 250 MG tablet Take first 2 tablets together, then 1 every day until finished. 6 tablet Reeve Mallo C, PA-C   benzonatate (TESSALON) 200 MG capsule Take 1  capsule (200 mg total) by mouth 3 (three) times daily as needed for up to 7 days for cough. 28 capsule Lyndon Chenoweth C, PA-C   promethazine-dextromethorphan (PROMETHAZINE-DM) 6.25-15 MG/5ML syrup Take 5 mLs by mouth 4 (four) times daily as needed for cough. 118 mL Kieli Golladay C, PA-C   Cetirizine HCl 10 MG CAPS Take 1 capsule (10 mg total) by mouth daily for 10 days. 10 capsule Janzen Sacks, [place] C, PA-C     PDMP not reviewed this encounter.   Lew Dawes, PA-C 04/19/21 1008

## 2021-05-05 ENCOUNTER — Ambulatory Visit: Payer: 59 | Admitting: Podiatry

## 2021-06-10 DIAGNOSIS — Z20822 Contact with and (suspected) exposure to covid-19: Secondary | ICD-10-CM | POA: Diagnosis not present

## 2021-12-19 ENCOUNTER — Ambulatory Visit
Admission: EM | Admit: 2021-12-19 | Discharge: 2021-12-19 | Disposition: A | Discharge status: Home / Self Care | Payer: BC Managed Care – PPO | Attending: Internal Medicine | Admitting: Internal Medicine

## 2021-12-19 ENCOUNTER — Other Ambulatory Visit: Payer: Self-pay

## 2021-12-19 DIAGNOSIS — E049 Nontoxic goiter, unspecified: Secondary | ICD-10-CM

## 2021-12-19 NOTE — ED Triage Notes (Signed)
Pt c/o noticing large mass around her neck area first noticed ~ 2 days ago. Denies excessive heat or cold intolerance, changes in weight, mental status, energy, appetite, trouble breathing.

## 2021-12-19 NOTE — Discharge Instructions (Signed)
You have an enlarged thyroid.  Blood work is pending.  We will call if there are any abnormalities.  Primary care assistance has been requested for you.  Someone from [facility] will reach out to you to set up a primary care appointment.  Please call provided contact information for endocrinology to set up an appointment for further evaluation and management of enlarged thyroid.

## 2021-12-19 NOTE — ED Provider Notes (Signed)
[facility]    CSN: [id number] Arrival date & time: 12/19/21  [id number]      History   Chief Complaint Chief Complaint  Patient presents with   throat lump    HPI [name] is a 36 y.o. female.   Patient presents with "lump in her neck" that she noticed approximately 2 to 3 days ago.  Denies any pain to the area.  Denies any fever or upper respiratory symptoms.  Denies sweats, cold chills, body aches, weight changes, decreased appetite, energy changes.  Denies any difficulty swallowing but reports that she can "feel it when she swallows".  Denies history of thyroid problems.    Past Medical History:  Diagnosis Date   Chlamydia ?2007   Heart murmur    Urinary tract infection    Vitamin D deficiency     Patient Active Problem List   Diagnosis Date Noted   Maternal anemia, with delivery 10/10/2017   Postpartum care following cesarean delivery (11/17) 10/09/2017   Cesarean delivery delivered 11/17 07/22/2012    Past Surgical History:  Procedure Laterality Date   CESAREAN SECTION     CESAREAN SECTION  07/20/2012   Procedure: CESAREAN SECTION;  Surgeon: Antionette Char, MD;  Location: [facility];  Service: Gynecology;  Laterality: N/A;   CESAREAN SECTION N/A 10/09/2017   Procedure: Repeat CESAREAN SECTION;  Surgeon: Maxie Better, MD;  Location: [facility];  Service: Obstetrics;  Laterality: N/A;  EDD: 10/14/17   LAPAROSCOPY FOR ECTOPIC PREGNANCY  Feb 2012   SALPINGECTOMY      OB History     Gravida  4   Para  2   Term  1   Preterm  1   AB  1   Living  3      SAB  0   IAB  0   Ectopic  1   Multiple  1   Live Births  2            Home Medications    Prior to Admission medications   Medication Sig Start Date End Date Taking? Authorizing Provider  ammonium lactate (AMLACTIN) 12 % cream Apply topically as needed for dry skin. 01/30/21   Vivi Barrack, DPM  azithromycin (ZITHROMAX) 250 MG tablet Take first 2  tablets together, then 1 every day until finished. 04/19/21   Wieters, Hallie C, PA-C  Cetirizine HCl 10 MG CAPS Take 1 capsule (10 mg total) by mouth daily for 10 days. 04/19/21 04/29/21  Wieters, Hallie C, PA-C  fluconazole (DIFLUCAN) 150 MG tablet Take 1 tablet (150 mg total) by mouth once a week. Patient not taking: Reported on 04/19/2021 01/30/21   Vivi Barrack, DPM  ibuprofen (ADVIL,MOTRIN) 600 MG tablet Take 1 tablet (600 mg total) every 6 (six) hours by mouth. 10/11/17   Marlinda Mike, CNM  iron polysaccharides (NIFEREX) 150 MG capsule Take 1 capsule (150 mg total) daily by mouth. 10/11/17   Marlinda Mike, CNM  magnesium oxide (MAG-OX) 400 (241.3 Mg) MG tablet Take 1 tablet (400 mg total) daily by mouth. 10/11/17   Marlinda Mike, CNM  oxyCODONE-acetaminophen (PERCOCET/ROXICET) 5-325 MG tablet Take 1 tablet every 4 (four) hours as needed by mouth (pain scale 4-7). Patient not taking: Reported on 04/19/2021 10/11/17   Marlinda Mike, CNM  promethazine-dextromethorphan (PROMETHAZINE-DM) 6.25-15 MG/5ML syrup Take 5 mLs by mouth 4 (four) times daily as needed for cough. 04/19/21   Wieters, Junius Creamer, PA-C    Family History Family History  Problem Relation Age of Onset   Hypertension Father    Anesthesia problems Neg Hx    Hypotension Neg Hx    Malignant hyperthermia Neg Hx    Pseudochol deficiency Neg Hx    Other Neg Hx     Social History Social History   Tobacco Use   Smoking status: Former    Types: Cigarettes    Quit date: 12/06/2011    Years since quitting: 10.0   Smokeless tobacco: Never  Substance Use Topics   Alcohol use: No   Drug use: No     Allergies   Patient has no known allergies.   Review of Systems Review of Systems Per HPI  Physical Exam Triage Vital Signs ED Triage Vitals  Enc Vitals Group     BP 12/19/21 0828 116/78     Pulse Rate 12/19/21 0828 (!) 108     Resp 12/19/21 0828 18     Temp 12/19/21 0828 98.2 F (36.8 C)     Temp Source 12/19/21 0828  Oral     SpO2 12/19/21 0828 98 %     Weight --      Height --      Head Circumference --      Peak Flow --      Pain Score 12/19/21 0829 0     Pain Loc --      Pain Edu? --      Excl. in GC? --    No data found.  Updated Vital Signs BP 116/78 (BP Location: Right Arm)    Pulse (!) 108    Temp 98.2 F (36.8 C) (Oral)    Resp 18    SpO2 98%    Breastfeeding No   Visual Acuity Right Eye Distance:   Left Eye Distance:   Bilateral Distance:    Right Eye Near:   Left Eye Near:    Bilateral Near:     Physical Exam Constitutional:      General: She is not in acute distress.    Appearance: Normal appearance. She is not toxic-appearing or diaphoretic.  HENT:     Head: Normocephalic and atraumatic.  Eyes:     Extraocular Movements: Extraocular movements intact.     Conjunctiva/sclera: Conjunctivae normal.  Neck:     Thyroid: Thyromegaly present. No thyroid tenderness.     Comments: Enlarged thyroid on physical exam. Pulmonary:     Effort: Pulmonary effort is normal.  Musculoskeletal:     Cervical back: Full passive range of motion without pain and normal range of motion.  Neurological:     General: No focal deficit present.     Mental Status: She is alert and oriented to person, place, and time. Mental status is at baseline.  Psychiatric:        Mood and Affect: Mood normal.        Behavior: Behavior normal.        Thought Content: Thought content normal.        Judgment: Judgment normal.     UC Treatments / Results  Labs (all labs ordered are listed, but only abnormal results are displayed) Labs Reviewed  TSH  T4, FREE    EKG   Radiology No results found.  Procedures Procedures (including critical care time)  Medications Ordered in UC Medications - No data to display  Initial Impression / Assessment and Plan / UC Course  I have reviewed the triage vital signs and the nursing notes.  Pertinent labs & imaging  results that were available during my care of  the patient were reviewed by me and considered in my medical decision making (see chart for details).     It appears that patient's thyroid is enlarged on physical exam.  Patient does not have history of thyroid disease.  TSH and free T4 pending.  Patient will need to follow-up with endocrinology for further evaluation and management.  Patient provided with contact information.  Do not think that patient is in need of immediate medical attention at the hospital at this time as vital signs are stable and she is able to swallow effectively.  Patient does not have PCP as well.  PCP assistance requested for patient in case an official referral is required for endocrinology.  Discussed strict return and ER precautions.  Patient verbalized understanding and was agreeable with plan. Final Clinical Impressions(s) / UC Diagnoses   Final diagnoses:  Enlarged thyroid     Discharge Instructions      You have an enlarged thyroid.  Blood work is pending.  We will call if there are any abnormalities.  Primary care assistance has been requested for you.  [facility] from [facility] will reach out to you to set up a primary care appointment.  Please call provided contact information for endocrinology to set up an appointment for further evaluation and management of enlarged thyroid.    ED Prescriptions   None    PDMP not reviewed this encounter.   Gustavus BryantMound, Dainel Arcidiacono E, [place]NP 12/19/21 [phone]

## 2021-12-20 LAB — TSH: TSH: 1.15 u[IU]/mL (ref 0.450–4.500)

## 2021-12-20 LAB — T4, FREE: Free T4: 1.42 ng/dL (ref 0.82–1.77)

## 2021-12-24 ENCOUNTER — Encounter (HOSPITAL_BASED_OUTPATIENT_CLINIC_OR_DEPARTMENT_OTHER): Payer: Self-pay | Admitting: *Deleted

## 2021-12-24 ENCOUNTER — Other Ambulatory Visit: Payer: Self-pay

## 2021-12-24 ENCOUNTER — Emergency Department (HOSPITAL_BASED_OUTPATIENT_CLINIC_OR_DEPARTMENT_OTHER): Payer: BC Managed Care – PPO

## 2021-12-24 ENCOUNTER — Emergency Department (HOSPITAL_BASED_OUTPATIENT_CLINIC_OR_DEPARTMENT_OTHER)
Admission: EM | Admit: 2021-12-24 | Discharge: 2021-12-24 | Disposition: A | Discharge status: Home / Self Care | Payer: BC Managed Care – PPO | Attending: Emergency Medicine | Admitting: Emergency Medicine

## 2021-12-24 DIAGNOSIS — E041 Nontoxic single thyroid nodule: Secondary | ICD-10-CM | POA: Insufficient documentation

## 2021-12-24 DIAGNOSIS — R221 Localized swelling, mass and lump, neck: Secondary | ICD-10-CM | POA: Diagnosis not present

## 2021-12-24 DIAGNOSIS — E010 Iodine-deficiency related diffuse (endemic) goiter: Secondary | ICD-10-CM | POA: Diagnosis not present

## 2021-12-24 NOTE — ED Provider Notes (Signed)
[facility] Provider Note   CSN: [id number] Arrival date & time: 12/24/21  [id number]     History  Chief Complaint  Patient presents with   Neck swelling    [name] is a 36 y.o. female.  Patient presents to ER chief complaint of neck swelling.  He states he has noticed it and her partner has noticed that within the last week.  They went to urgent care had some thyroid studies done and were sent out to follow-up with her primary care doctor but they have an appointment tomorrow.  However the patient too anxious and presents to the ER today.  She denies any fevers any cough any vomiting or any diarrhea.      Home Medications Prior to Admission medications   Medication Sig Start Date End Date Taking? Authorizing Provider  ammonium lactate (AMLACTIN) 12 % cream Apply topically as needed for dry skin. 01/30/21   Trula Slade, DPM  azithromycin (ZITHROMAX) 250 MG tablet Take first 2 tablets together, then 1 every day until finished. 04/19/21   Wieters, Hallie C, PA-C  Cetirizine HCl 10 MG CAPS Take 1 capsule (10 mg total) by mouth daily for 10 days. 04/19/21 04/29/21  Wieters, Hallie C, PA-C  fluconazole (DIFLUCAN) 150 MG tablet Take 1 tablet (150 mg total) by mouth once a week. Patient not taking: Reported on 04/19/2021 01/30/21   Trula Slade, DPM  ibuprofen (ADVIL,MOTRIN) 600 MG tablet Take 1 tablet (600 mg total) every 6 (six) hours by mouth. 10/11/17   Artelia Laroche, CNM  iron polysaccharides (NIFEREX) 150 MG capsule Take 1 capsule (150 mg total) daily by mouth. 10/11/17   Artelia Laroche, CNM  magnesium oxide (MAG-OX) 400 (241.3 Mg) MG tablet Take 1 tablet (400 mg total) daily by mouth. 10/11/17   Artelia Laroche, CNM  oxyCODONE-acetaminophen (PERCOCET/ROXICET) 5-325 MG tablet Take 1 tablet every 4 (four) hours as needed by mouth (pain scale 4-7). Patient not taking: Reported on 04/19/2021 10/11/17   Artelia Laroche, CNM  promethazine-dextromethorphan  (PROMETHAZINE-DM) 6.25-15 MG/5ML syrup Take 5 mLs by mouth 4 (four) times daily as needed for cough. 04/19/21   Wieters, Hallie C, PA-C      Allergies    Patient has no known allergies.    Review of Systems   Review of Systems  Constitutional:  Negative for fever.  HENT:  Negative for ear pain.   Eyes:  Negative for pain.  Respiratory:  Negative for cough.   Cardiovascular:  Negative for chest pain.  Gastrointestinal:  Negative for abdominal pain.  Genitourinary:  Negative for flank pain.  Musculoskeletal:  Negative for back pain.  Skin:  Negative for rash.  Neurological:  Negative for headaches.   Physical Exam Updated Vital Signs BP (!) 115/56    Pulse 82    Temp 98.6 F (37 C)    Resp 17    SpO2 100%  Physical Exam Constitutional:      General: She is not in acute distress.    Appearance: Normal appearance.  HENT:     Head: Normocephalic.     Comments: Positive soft nontender, anterior lower neck mass clinically suspect thyroid enlargement.  No airway impingement noted.    Nose: Nose normal.  Eyes:     Extraocular Movements: Extraocular movements intact.  Cardiovascular:     Rate and Rhythm: Normal rate.  Pulmonary:     Effort: Pulmonary effort is normal.  Musculoskeletal:        General: Normal  range of motion.     Cervical back: Normal range of motion.  Neurological:     General: No focal deficit present.     Mental Status: She is alert. Mental status is at baseline.    ED Results / Procedures / Treatments   Labs (all labs ordered are listed, but only abnormal results are displayed) Labs Reviewed - No data to display  EKG None  Radiology US THYROID  Result Date: 12/24/2021 CLINICAL DATA:  Neck swelling EXAM: THYROID ULTRASOUND TECHNIQUE: Ultrasound examination of the thyroid gland and adjacent soft tissues was performed. COMPARISON:  None. FINDINGS: Parenchymal Echotexture: Markedly heterogenous Isthmus: 0.5 cm thickness Right lobe: 5.9 x 2.5 x 2.2 cm Left  lobe: 7 x 2.9 x 3.5 cm _________________________________________________________ Estimated total number of nodules >/= 1 cm: 3 Number of spongiform nodules >/=  2 cm not described below (TR1): 0 Number of mixed cystic and solid nodules >/= 1.5 cm not described below (TR2): 0 _________________________________________________________ Nodule 1: 0.7 cm hypoechoic nodule without calcifications, superior right; This nodule does NOT meet TI-RADS criteria for biopsy or dedicated follow-up. Nodule # 2: Location: Right; inferior Maximum size: 3.5 cm; Other 2 dimensions: 2.5 x 2.1 cm Composition: solid/almost completely solid (2) Echogenicity: hypoechoic (2) Shape: not taller-than-wide (0) Margins: smooth (0) Echogenic foci: none (0) ACR TI-RADS total points: 4. ACR TI-RADS risk category: TR 4. ACR TI-RADS recommendations: **Given size (>/= 1.5 cm) and appearance, fine needle aspiration of this moderately suspicious nodule should be considered based on TI-RADS criteria. _________________________________________________________ Nodule # 3: Location: Left; superior Maximum size: 1.2 cm; Other 2 dimensions: 0.8 x 0.7 cm Composition: mixed cystic and solid (1) Echogenicity: isoechoic (1) Shape: not taller-than-wide (0) Margins: smooth (0) Echogenic foci: none (0) ACR TI-RADS total points: 2. ACR TI-RADS risk category: TR 2. ACR TI-RADS recommendations: This nodule does NOT meet TI-RADS criteria for biopsy or dedicated follow-up. _________________________________________________________ Nodule # 4: Location: Left; inferior Maximum size: 5.9 cm; Other 2 dimensions: 4.3 x 2.6 cm Composition: mixed cystic and solid (1) Echogenicity: isoechoic (1) Shape: not taller-than-wide (0) Margins: ill-defined (0) Echogenic foci: none (0) ACR TI-RADS total points: 2. ACR TI-RADS risk category: TR 2. ACR TI-RADS recommendations: This nodule does NOT meet TI-RADS criteria for biopsy or dedicated follow-up.  _________________________________________________________ No regional cervical adenopathy. IMPRESSION: 1. Thyromegaly with bilateral nodules. 2. Recommend FNA biopsy of moderately suspicious 3.5 cm inferior right nodule. The above is in keeping with the ACR TI-RADS recommendations - J Am Coll Radiol 2017;14:587-595. INDICATION: Neck swelling Electronically Signed   By: Lucrezia Europe M.D.   On: 12/24/2021 12:53    Procedures Procedures    Medications Ordered in ED Medications - No data to display  ED Course/ Medical Decision Making/ A&P                           Medical Decision Making Amount and/or Complexity of Data Reviewed Radiology: ordered.   Chart review shows an office visit June 10, 2021 with a COVID exposure.  No additional labs or studies with Patient's vital signs are within normal limits.  Ultrasound performed showing bilateral thyroid nodules.  Patient given follow-up information with ENT.  She does have an appointment with her primary doctor tomorrow as well.         Final Clinical Impression(s) / ED Diagnoses Final diagnoses:  Neck mass  Thyroid nodule    Rx / DC Orders ED Discharge Orders  None         Luna Fuse, MD 12/24/21 [phone]

## 2021-12-24 NOTE — ED Notes (Signed)
Rainbow drawn and sent to lab, await MD orders

## 2021-12-24 NOTE — ED Triage Notes (Signed)
Pt is here for re-evaluation of swelling she noted on her neck around a week ago.  Pt was seen at [facility] but reason for swelling was not found and she was told to go get a PCP or come to ED.  Pt continues to be concerned about this.  No sob or difficulty swallowing

## 2021-12-24 NOTE — Discharge Instructions (Signed)
Keep appointment with your doctor tomorrow.  I have put in a request for you to follow-up with ENT within the next week.  Call the doctor listed on your discharge paperwork to schedule an appointment.  Return back to the ER if you have fevers pain or any additional concerns.

## 2021-12-25 ENCOUNTER — Other Ambulatory Visit: Payer: BC Managed Care – PPO

## 2021-12-25 ENCOUNTER — Ambulatory Visit: Payer: BC Managed Care – PPO | Admitting: Family

## 2021-12-25 ENCOUNTER — Encounter: Payer: Self-pay | Admitting: Family

## 2021-12-25 VITALS — BP 111/74 | HR 85 | Temp 98.5°F | Ht 62.0 in | Wt 143.6 lb

## 2021-12-25 DIAGNOSIS — Z30016 Encounter for initial prescription of transdermal patch hormonal contraceptive device: Secondary | ICD-10-CM

## 2021-12-25 DIAGNOSIS — Z0000 Encounter for general adult medical examination without abnormal findings: Secondary | ICD-10-CM

## 2021-12-25 LAB — COMPREHENSIVE METABOLIC PANEL
ALT: 8 U/L (ref 0–35)
AST: 15 U/L (ref 0–37)
Albumin: 4.4 g/dL (ref 3.5–5.2)
Alkaline Phosphatase: 69 U/L (ref 39–117)
BUN: 12 mg/dL (ref 6–23)
CO2: 27 mEq/L (ref 19–32)
Calcium: 9.5 mg/dL (ref 8.4–10.5)
Chloride: 104 mEq/L (ref 96–112)
Creatinine, Ser: 0.74 mg/dL (ref 0.40–1.20)
GFR: 105.26 mL/min (ref >60.00)
Glucose, Bld: 88 mg/dL (ref 70–99)
Potassium: 4.3 mEq/L (ref 3.5–5.1)
Sodium: 137 mEq/L (ref 135–145)
Total Bilirubin: 0.7 mg/dL (ref 0.2–1.2)
Total Protein: 7.6 g/dL (ref 6.0–8.3)

## 2021-12-25 LAB — CBC WITH DIFFERENTIAL/PLATELET
Basophils Absolute: 0 10*3/uL (ref 0.0–0.1)
Basophils Relative: 0.5 % (ref 0.0–3.0)
Eosinophils Absolute: 0.1 10*3/uL (ref 0.0–0.7)
Eosinophils Relative: 2 % (ref 0.0–5.0)
HCT: 40.1 % (ref 36.0–46.0)
Hemoglobin: 13.1 g/dL (ref 12.0–15.0)
Lymphocytes Relative: 43.1 % (ref 12.0–46.0)
Lymphs Abs: 2 10*3/uL (ref 0.7–4.0)
MCHC: 32.6 g/dL (ref 30.0–36.0)
MCV: 93.9 fl (ref 78.0–100.0)
Monocytes Absolute: 0.6 10*3/uL (ref 0.1–1.0)
Monocytes Relative: 12.5 % — ABNORMAL HIGH (ref 3.0–12.0)
Neutro Abs: 2 10*3/uL (ref 1.4–7.7)
Neutrophils Relative %: 41.9 % — ABNORMAL LOW (ref 43.0–77.0)
Platelets: 229 10*3/uL (ref 150.0–400.0)
RBC: 4.27 Mil/uL (ref 3.87–5.11)
RDW: 13.1 % (ref 11.5–15.5)
WBC: 4.7 10*3/uL (ref 4.0–10.5)

## 2021-12-25 LAB — LIPID PANEL
Cholesterol: 165 mg/dL (ref 0–200)
HDL: 66.1 mg/dL (ref >39.00)
LDL Cholesterol: 88 mg/dL (ref 0–99)
NonHDL: 99.35
Total CHOL/HDL Ratio: 3
Triglycerides: 55 mg/dL (ref 0.0–149.0)
VLDL: 11 mg/dL (ref 0.0–40.0)

## 2021-12-25 MED ORDER — NORELGESTROMIN-ETH ESTRADIOL 150-35 MCG/24HR TD PTWK: 1.0000 | MEDICATED_PATCH | TRANSDERMAL | 12 refills | Status: DC

## 2021-12-25 NOTE — Patient Instructions (Signed)
Welcome to [facility]! It was a pleasure meeting you today.  Go to the lab for blood work today. I have sent your Xulane patch prescription to your pharmacy. You can start this today, or 1st day of your period.    PLEASE NOTE:  If you had any LAB tests please let us know if you have not heard back within a few days. You may see your results on MyChart before we have a chance to review them but we will give you a call once they are reviewed by [place]. If we ordered any REFERRALS today, please let us know if you have not heard from their office within the next week.  Let us know through MyChart if you are needing REFILLS, or have your pharmacy send [place] the request. You can also use MyChart to communicate with me or any office staff.  Please try these tips to maintain a healthy lifestyle:  Eat most of your calories during the day when you are active. Eliminate processed foods including packaged sweets (pies, cakes, cookies), reduce intake of potatoes, white bread, white pasta, and white rice. Look for whole grain options, oat flour or almond flour.  Each meal should contain half fruits/vegetables, one quarter protein, and one quarter carbs (no bigger than a computer mouse).  Cut down on sweet beverages. This includes juice, soda, and sweet tea. Also watch fruit intake, though this is a healthier sweet option, it still contains natural sugar! Limit to 3 servings daily.  Drink at least 1 glass of water with each meal and aim for at least 8 glasses per day  Exercise at least 150 minutes every week.

## 2021-12-25 NOTE — Progress Notes (Signed)
Phone [phone]   Subjective:   Patient is a 36 y.o. female presenting for annual physical.    Chief Complaint  Patient presents with   Establish Care   Annual Exam    See problem oriented charting- ROS- full  review of systems was completed and negative.  Contraception Counseling: Patient presents for contraception counseling. The patient has no complaints today. The patient is sexually active. Pertinent past medical history: none. Reports using withdrawel method currently with husband. History of using transdermal patch in past.  The following were reviewed and entered/updated in epic: Past Medical History:  Diagnosis Date   Chlamydia ?2007   Heart murmur    Postpartum care following cesarean delivery (11/17) 10/09/2017   Urinary tract infection    Vitamin D deficiency    Patient Active Problem List   Diagnosis Date Noted   Encounter for initial prescription of transdermal patch hormonal contraceptive device 12/25/2021   Maternal anemia, with delivery 10/10/2017   Cesarean delivery delivered 11/17 07/22/2012   Past Surgical History:  Procedure Laterality Date   CESAREAN SECTION     CESAREAN SECTION  07/20/2012   Procedure: CESAREAN SECTION;  Surgeon: Antionette Char, MD;  Location: [facility];  Service: Gynecology;  Laterality: N/A;   CESAREAN SECTION N/A 10/09/2017   Procedure: Repeat CESAREAN SECTION;  Surgeon: Maxie Better, MD;  Location: [facility];  Service: Obstetrics;  Laterality: N/A;  EDD: 10/14/17   LAPAROSCOPY FOR ECTOPIC PREGNANCY  Feb 2012   SALPINGECTOMY      Family History  Problem Relation Age of Onset   Hyperlipidemia Father    Hypertension Father    Depression Maternal Grandmother    Anesthesia problems Neg Hx    Hypotension Neg Hx    Malignant hyperthermia Neg Hx    Pseudochol deficiency Neg Hx    Other Neg Hx     Medications- reviewed and updated Current Outpatient Medications  Medication Sig Dispense Refill    norelgestromin-ethinyl estradiol [employer]) 150-35 MCG/24HR transdermal patch Place 1 patch onto the skin once a week. 3 patch 12   No current facility-administered medications for this visit.    Allergies-reviewed and updated No Known Allergies  Social History   Social History Narrative   Not on file   Objective  Objective:  BP 111/74    Pulse 85    Temp 98.5 F (36.9 C) (Temporal)    Ht 5\' 2"  (1.575 m)    Wt 143 lb 9.6 oz (65.1 kg)    LMP 12/11/2021 (Approximate)    SpO2 100%    BMI 26.26 kg/m  Gen: NAD, resting comfortably HEENT: Mucous membranes are moist. Oropharynx normal Neck: no thyromegaly CV: RRR no murmurs rubs or gallops Lungs: CTAB no crackles, wheeze, rhonchi Abdomen: soft/nontender/nondistended/normal bowel sounds. No rebound or guarding.  Ext: no edema Skin: warm, dry Neuro: grossly normal, moves all extremities, PERRLA   Assessment and Plan   Health Maintenance counseling: 1. Anticipatory guidance: Patient counseled regarding regular dental exams q6 months, eye exams,  avoiding smoking and second hand smoke, limiting alcohol to 1 beverage per day, no illicit drugs.   2. Risk factor reduction:  Advised patient of need for regular exercise and diet rich with fruits and vegetables to reduce risk of heart attack and stroke. Wt Readings from Last 3 Encounters:  12/25/21 143 lb 9.6 oz (65.1 kg)  10/09/17 161 lb (73 kg)  09/30/17 160 lb (72.6 kg)   3. Immunizations/screenings/ancillary studies Immunization History  Administered Date(s) Administered  Rho (D) Immune Globulin 11/12/2011, 04/18/2012, 07/21/2012   Health Maintenance Due  Topic Date Due   Hepatitis C Screening  Never done   TETANUS/TDAP  Never done   PAP SMEAR-Modifier  Never done    4. Cervical cancer screening: has appt this year 5. Skin cancer screening- advised regular sunscreen use. Denies worrisome, changing, or new skin lesions.  6. Birth control/STD check: N/A  7. Smoking associated  screening: non- smoker 8. Alcohol screening: occasional   Problem List Items Addressed This Visit       Other   Encounter for initial prescription of transdermal patch hormonal contraceptive device    reviewed all options with patient, pros & cons, side effects, pt would like to restart Xulane patches that she has used in the past.      Relevant Medications   norelgestromin-ethinyl estradiol [employer]) 150-35 MCG/24HR transdermal patch   Other Relevant Orders   POCT Pregnancy, Urine   Other Visit Diagnoses     Annual physical exam    -  Primary   Relevant Orders   Comprehensive metabolic panel   Lipid panel   CBC with Differential/Platelet       Recommended follow up: Return for any future concerns. No future appointments.  Lab/Order associations: non- fasting   ICD-10-CM   1. Annual physical exam  Z00.00 Comprehensive metabolic panel    Lipid panel    CBC with Differential/Platelet    Lipid panel    Comprehensive metabolic panel    CBC with Differential/Platelet    2. Encounter for initial prescription of transdermal patch hormonal contraceptive device  Z30.016 POCT Pregnancy, Urine    norelgestromin-ethinyl estradiol [employer]) 150-35 MCG/24HR transdermal patch       Dulce Sellar, NP

## 2021-12-25 NOTE — Assessment & Plan Note (Signed)
reviewed all options with patient, pros & cons, side effects, pt would like to restart Xulane patches that she has used in the past.

## 2021-12-26 LAB — HCG, SERUM, QUALITATIVE: Preg, Serum: NEGATIVE

## 2021-12-30 DIAGNOSIS — H6123 Impacted cerumen, bilateral: Secondary | ICD-10-CM | POA: Diagnosis not present

## 2021-12-30 DIAGNOSIS — D440 Neoplasm of uncertain behavior of thyroid gland: Secondary | ICD-10-CM | POA: Diagnosis not present

## 2021-12-31 ENCOUNTER — Other Ambulatory Visit: Payer: Self-pay | Admitting: Otolaryngology

## 2021-12-31 DIAGNOSIS — E041 Nontoxic single thyroid nodule: Secondary | ICD-10-CM

## 2022-01-22 ENCOUNTER — Ambulatory Visit
Admission: RE | Admit: 2022-01-22 | Discharge: 2022-01-22 | Disposition: A | Discharge status: Home / Self Care | Payer: BC Managed Care – PPO | Source: Ambulatory Visit | Attending: Otolaryngology | Admitting: Otolaryngology

## 2022-01-22 ENCOUNTER — Other Ambulatory Visit (HOSPITAL_COMMUNITY)
Admission: RE | Admit: 2022-01-22 | Discharge: 2022-01-22 | Disposition: A | Discharge status: Home / Self Care | Payer: BC Managed Care – PPO | Source: Ambulatory Visit | Attending: Physician Assistant | Admitting: Physician Assistant

## 2022-01-22 DIAGNOSIS — E041 Nontoxic single thyroid nodule: Secondary | ICD-10-CM

## 2022-01-23 LAB — CYTOLOGY - NON PAP

## 2022-01-31 DIAGNOSIS — F411 Generalized anxiety disorder: Secondary | ICD-10-CM | POA: Diagnosis not present

## 2022-02-03 ENCOUNTER — Encounter: Payer: Self-pay | Admitting: Family

## 2022-02-06 DIAGNOSIS — F411 Generalized anxiety disorder: Secondary | ICD-10-CM | POA: Diagnosis not present

## 2022-02-27 DIAGNOSIS — F411 Generalized anxiety disorder: Secondary | ICD-10-CM | POA: Diagnosis not present

## 2022-03-13 DIAGNOSIS — F411 Generalized anxiety disorder: Secondary | ICD-10-CM | POA: Diagnosis not present

## 2022-03-17 ENCOUNTER — Telehealth: Payer: Self-pay | Admitting: Family

## 2022-03-17 NOTE — Telephone Encounter (Signed)
Patient calling to see if she got TB shot before- would like to sch one if not-  ?

## 2022-03-18 NOTE — Telephone Encounter (Signed)
I called and spoke with pt. Scheduled a nurse visit for 4/27@ 3:30pm. ?

## 2022-03-19 ENCOUNTER — Other Ambulatory Visit (INDEPENDENT_AMBULATORY_CARE_PROVIDER_SITE_OTHER): Payer: BC Managed Care – PPO

## 2022-03-19 ENCOUNTER — Ambulatory Visit: Payer: BC Managed Care – PPO

## 2022-03-19 DIAGNOSIS — Z111 Encounter for screening for respiratory tuberculosis: Secondary | ICD-10-CM

## 2022-03-21 LAB — QUANTIFERON-TB GOLD PLUS
Mitogen-NIL: 9 IU/mL
NIL: 0.01 IU/mL
QuantiFERON-TB Gold Plus: NEGATIVE
TB1-NIL: 0 IU/mL
TB2-NIL: 0 IU/mL

## 2022-04-01 ENCOUNTER — Telehealth: Payer: Self-pay | Admitting: Family

## 2022-04-01 NOTE — Telephone Encounter (Signed)
Patient had a TB test on 4/27- stated she handed in forms to [facility] and was told they were going to be handed to [facility].  ? ?Patient is following up on forms and would like to know if she can pick up to provide to employer.  ?

## 2022-04-01 NOTE — Telephone Encounter (Signed)
I called pt, she will be here around 3:30PM today.  ?

## 2022-04-10 DIAGNOSIS — F411 Generalized anxiety disorder: Secondary | ICD-10-CM | POA: Diagnosis not present

## 2022-08-17 ENCOUNTER — Encounter: Payer: Self-pay | Admitting: *Deleted

## 2022-11-05 ENCOUNTER — Encounter: Payer: Self-pay | Admitting: *Deleted

## 2022-12-25 ENCOUNTER — Telehealth: Payer: BC Managed Care – PPO | Admitting: Family Medicine

## 2022-12-25 DIAGNOSIS — R6889 Other general symptoms and signs: Secondary | ICD-10-CM | POA: Diagnosis not present

## 2022-12-25 MED ORDER — OSELTAMIVIR PHOSPHATE 75 MG PO CAPS: 75.0000 mg | ORAL_CAPSULE | Freq: Two times a day (BID) | ORAL | 0 refills | Status: AC

## 2022-12-25 NOTE — Progress Notes (Signed)
Virtual Visit Consent   [name], you are scheduled for a virtual visit with a [facility] provider today. Just as with appointments in the office, your consent must be obtained to participate. Your consent will be active for this visit and any virtual visit you may have with one of our providers in the next 365 days. If you have a MyChart account, a copy of this consent can be sent to you electronically.  As this is a virtual visit, video technology does not allow for your provider to perform a traditional examination. This may limit your provider's ability to fully assess your condition. If your provider identifies any concerns that need to be evaluated in person or the need to arrange testing (such as labs, EKG, etc.), we will make arrangements to do so. Although advances in technology are sophisticated, we cannot ensure that it will always work on either your end or our end. If the connection with a video visit is poor, the visit may have to be switched to a telephone visit. With either a video or telephone visit, we are not always able to ensure that we have a secure connection.  By engaging in this virtual visit, you consent to the provision of healthcare and authorize for your insurance to be billed (if applicable) for the services provided during this visit. Depending on your insurance coverage, you may receive a charge related to this service.  I need to obtain your verbal consent now. Are you willing to proceed with your visit today? [name] has provided verbal consent on 12/25/2022 for a virtual visit (video or telephone). Perlie Mayo, NP  Date: 12/25/2022 2:26 PM  Virtual Visit via Video Note   I, Perlie Mayo, connected with  [name]  ([id number], Mar 18, 1986) on 12/25/22 at  2:30 PM EST by a video-enabled telemedicine application and verified that I am speaking with the correct person using two identifiers.  Location: Patient: Virtual Visit Location Patient:  Home Provider: Virtual Visit Location Provider: Home Office   I discussed the limitations of evaluation and management by telemedicine and the availability of in person appointments. The patient expressed understanding and agreed to proceed.    History of Present Illness: [name] is a 37 y.o. who identifies as a female who was assigned female at birth, and is being seen today for sore throat started hurting today- woke her up out of sleeping in early morning hours. Feels like it has gotten worse- body aches and pains, headaches, chills-cold feeling, congestion. Mild stomach ache as well..  Known Flu exposure recently   Denies chest pain, shortness of breath, or breathing trouble, n/v/d  Problems:  Patient Active Problem List   Diagnosis Date Noted   Encounter for initial prescription of transdermal patch hormonal contraceptive device 12/25/2021   Maternal anemia, with delivery 10/10/2017   Cesarean delivery delivered 11/17 07/22/2012    Allergies: No Known Allergies Medications:  Current Outpatient Medications:    norelgestromin-ethinyl estradiol Marilu Favre) 150-35 MCG/24HR transdermal patch, Place 1 patch onto the skin once a week., Disp: 3 patch, Rfl: 12  Observations/Objective: Patient is well-developed, well-nourished in no acute distress.  Resting comfortably  at home.  Head is normocephalic, atraumatic.  No labored breathing.  Speech is clear and coherent with logical content.  Patient is alert and oriented at baseline.    Assessment and Plan:  1. Flu-like symptoms   - oseltamivir (TAMIFLU) 75 MG capsule; Take 1 capsule (75 mg total)  by  mouth 2 (two) times daily for 5 days.  Dispense: 10 capsule; Refill: 0  - Continue OTC symptomatic management of choice plus above - Push fluids - Rest as needed - Discussed return precautions and when to seek in-person evaluation, sent via AVS as well    Reviewed side effects, risks and benefits of medication.    Patient  acknowledged agreement and understanding of the plan.   Past Medical, Surgical, Social History, Allergies, and Medications have been Reviewed.    Follow Up Instructions: I discussed the assessment and treatment plan with the patient. The patient was provided an opportunity to ask questions and all were answered. The patient agreed with the plan and demonstrated an understanding of the instructions.  A copy of instructions were sent to the patient via MyChart unless otherwise noted below.    The patient was advised to call back or seek an in-person evaluation if the symptoms worsen or if the condition fails to improve as anticipated.  Time:  I spent 10 minutes with the patient via telehealth technology discussing the above problems/concerns.    Perlie Mayo, NP

## 2022-12-25 NOTE — Patient Instructions (Addendum)
Melchor Amour, thank you for joining Perlie Mayo, NP for today's virtual visit.  While this provider is not your primary care provider (PCP), if your PCP is located in our provider database this encounter information will be shared with them immediately following your visit.   [facility] account gives you access to today's visit and all your visits, tests, and labs performed at [facility] " click here if you don't have a [facility] account or go to [email or web address]  Consent: (Patient) SHERIANN NEWMANN provided verbal consent for this virtual visit at the beginning of the encounter.  Current Medications:  Current Outpatient Medications:    oseltamivir (TAMIFLU) 75 MG capsule, Take 1 capsule (75 mg total) by mouth 2 (two) times daily for 5 days., Disp: 10 capsule, Rfl: 0   norelgestromin-ethinyl estradiol (XULANE) 150-35 MCG/24HR transdermal patch, Place 1 patch onto the skin once a week., Disp: 3 patch, Rfl: 12   Medications ordered in this encounter:  Meds ordered this encounter  Medications   oseltamivir (TAMIFLU) 75 MG capsule    Sig: Take 1 capsule (75 mg total) by mouth 2 (two) times daily for 5 days.    Dispense:  10 capsule    Refill:  0    Order Specific Question:   Supervising Provider    Answer:   Chase Picket [id number]     *If you need refills on other medications prior to your next appointment, please contact your pharmacy*  Follow-Up: Call back or seek an in-person evaluation if the symptoms worsen or if the condition fails to improve as anticipated.  [employer] [phone]  Other Instructions  Influenza, Adult Influenza is also called "the flu." It is an infection in the lungs, nose, and throat (respiratory tract). It spreads easily from person to person (is contagious). The flu causes symptoms that are like a cold, along with high fever and body aches. What are the causes? This condition is  caused by the influenza virus. You can get the virus by: Breathing in droplets that are in the air after a person infected with the flu coughed or sneezed. Touching something that has the virus on it and then touching your mouth, nose, or eyes. What increases the risk? Certain things may make you more likely to get the flu. These include: Not washing your hands often. Having close contact with many people during cold and flu season. Touching your mouth, eyes, or nose without first washing your hands. Not getting a flu shot every year. You may have a higher risk for the flu, and serious problems, such as a lung infection (pneumonia), if you: Are older than 65. Are pregnant. Have a weakened disease-fighting system (immune system) because of a disease or because you are taking certain medicines. Have a long-term (chronic) condition, such as: Heart, kidney, or lung disease. Diabetes. Asthma. Have a liver disorder. Are very overweight (morbidly obese). Have anemia. What are the signs or symptoms? Symptoms usually begin suddenly and last 4-14 days. They may include: Fever and chills. Headaches, body aches, or muscle aches. Sore throat. Cough. Runny or stuffy (congested) nose. Feeling discomfort in your chest. Not wanting to eat as much as normal. Feeling weak or tired. Feeling dizzy. Feeling sick to your stomach or throwing up. How is this treated? If the flu is found early, you can be treated with antiviral medicine. This can help to reduce how bad the illness is and how long it  lasts. This may be given by mouth or through an IV tube. Taking care of yourself at home can help your symptoms get better. Your doctor may want you to: Take over-the-counter medicines. Drink plenty of fluids. The flu often goes away on its own. If you have very bad symptoms or other problems, you may be treated in a hospital. Follow these instructions at home:     Activity Rest as needed. Get plenty of  sleep. Stay home from work or school as told by your doctor. Do not leave home until you do not have a fever for 24 hours without taking medicine. Leave home only to go to your doctor. Eating and drinking Take an ORS (oral rehydration solution). This is a drink that is sold at pharmacies and stores. Drink enough fluid to keep your pee pale yellow. Drink clear fluids in small amounts as you are able. Clear fluids include: Water. Ice chips. Fruit juice mixed with water. Low-calorie sports drinks. Eat bland foods that are easy to digest. Eat small amounts as you are able. These foods include: Bananas. Applesauce. Rice. Lean meats. Toast. Crackers. Do not eat or drink: Fluids that have a lot of sugar or caffeine. Alcohol. Spicy or fatty foods. General instructions Take over-the-counter and prescription medicines only as told by your doctor. Use a cool mist humidifier to add moisture to the air in your home. This can make it easier for you to breathe. When using a cool mist humidifier, clean it daily. Empty water and replace with clean water. Cover your mouth and nose when you cough or sneeze. Wash your hands with soap and water often and for at least 20 seconds. This is also important after you cough or sneeze. If you cannot use soap and water, use alcohol-based hand sanitizer. Keep all follow-up visits. How is this prevented?  Get a flu shot every year. You may get the flu shot in late summer, fall, or winter. Ask your doctor when you should get your flu shot. Avoid contact with people who are sick during fall and winter. This is cold and flu season. Contact a doctor if: You get new symptoms. You have: Chest pain. Watery poop (diarrhea). A fever. Your cough gets worse. You start to have more mucus. You feel sick to your stomach. You throw up. Get help right away if you: Have shortness of breath. Have trouble breathing. Have skin or nails that turn a bluish color. Have  very bad pain or stiffness in your neck. Get a sudden headache. Get sudden pain in your face or ear. Cannot eat or drink without throwing up. These symptoms may represent a serious problem that is an emergency. Get medical help right away. Call your local emergency services (911 in the [place] Do not wait to see if the symptoms will go away. Do not drive yourself to the hospital. Summary Influenza is also called "the flu." It is an infection in the lungs, nose, and throat. It spreads easily from person to person. Take over-the-counter and prescription medicines only as told by your doctor. Getting a flu shot every year is the best way to not get the flu. This information is not intended to replace advice given to you by your health care provider. Make sure you discuss any questions you have with your health care provider. Document Revised: 06/28/2020 Document Reviewed: 06/28/2020 Elsevier Patient Education  [place].    If you have been instructed to have an in-person evaluation today at a  local Urgent Care facility, please use the link below. It will take you to a list of all of our available [facility], including address, phone number and hours of operation. Please do not delay care.  [facility]  If you or a family member do not have a primary care provider, use the link below to schedule a visit and establish care. When you choose a [facility] primary care physician or advanced practice provider, you gain a long-term partner in health. Find a Primary Care Provider  Learn more about [facility]'s in-office and virtual care options: [facility]

## 2022-12-28 ENCOUNTER — Other Ambulatory Visit: Payer: Self-pay | Admitting: Otolaryngology

## 2022-12-28 ENCOUNTER — Telehealth: Payer: BC Managed Care – PPO

## 2022-12-28 DIAGNOSIS — E041 Nontoxic single thyroid nodule: Secondary | ICD-10-CM

## 2023-01-18 ENCOUNTER — Encounter (INDEPENDENT_AMBULATORY_CARE_PROVIDER_SITE_OTHER): Payer: BC Managed Care – PPO | Admitting: Family

## 2023-01-19 DIAGNOSIS — Z7189 Other specified counseling: Secondary | ICD-10-CM | POA: Diagnosis not present

## 2023-01-19 NOTE — Telephone Encounter (Signed)
Please see the MyChart message reply(ies) for my assessment and plan.  The patient gave consent for this Medical Advice Message and is aware that it may result in a bill to their insurance company as well as the possibility that this may result in a co-payment or deductible. They are an established patient, but are not seeking medical advice exclusively about a problem treated during an in person or video visit in the last 7 days. I did not recommend an in person or video visit within 7 days of my reply.  I spent a total of 11 minutes cumulative time within 7 days through [place], NP

## 2023-01-25 IMAGING — US US THYROID
1 series · 13 of 25 positions shown · non-contrast
Comparison: None.

CLINICAL DATA: Neck swelling

EXAM:
THYROID ULTRASOUND
TECHNIQUE: Ultrasound examination of the thyroid gland and adjacent soft
tissues was performed.
INDICATION: Neck swelling

[Series 1: us thyroid · 55 acquisitions, 13 frames shown]
[im 1/55]
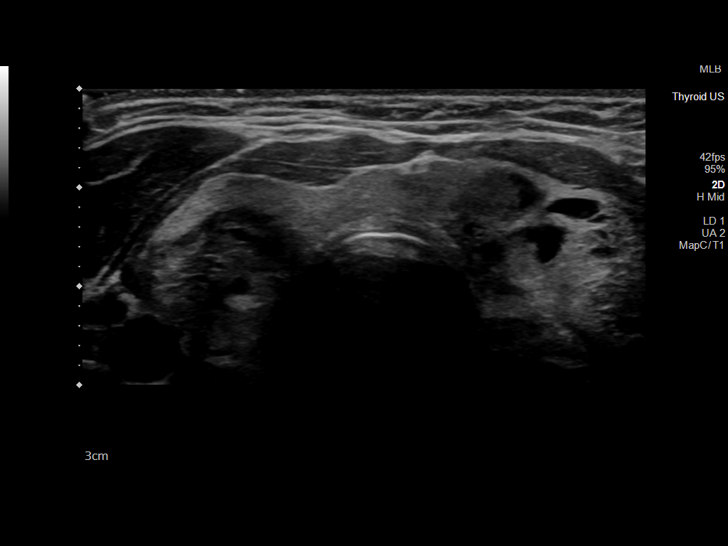
[im 5/55]
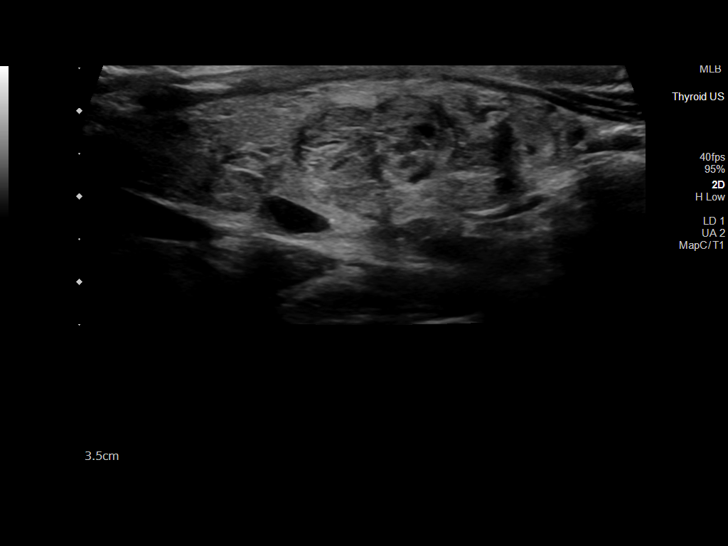
[im 10/55]
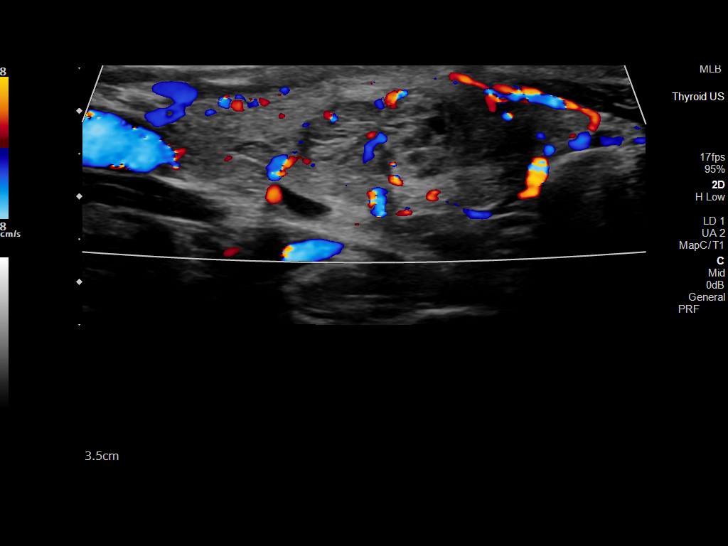
[im 14/55]
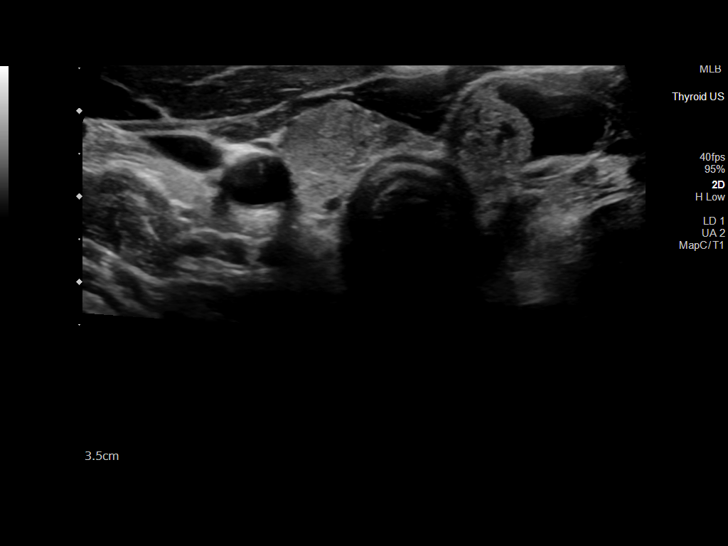
[im 19/55]
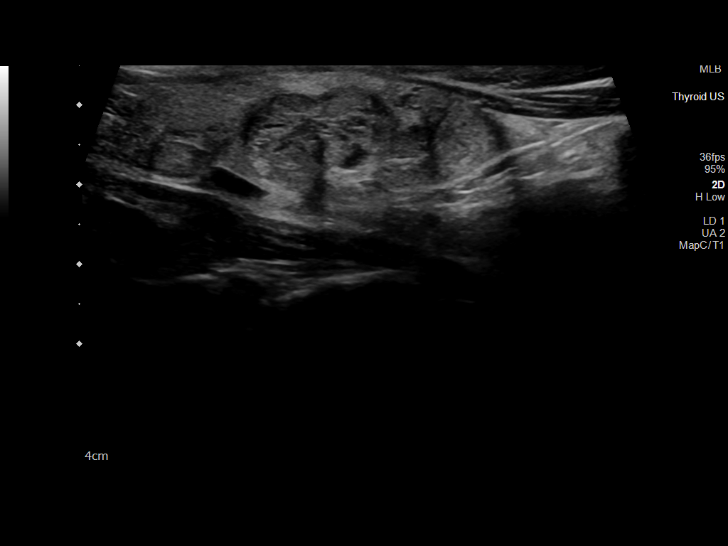
[im 23/55]
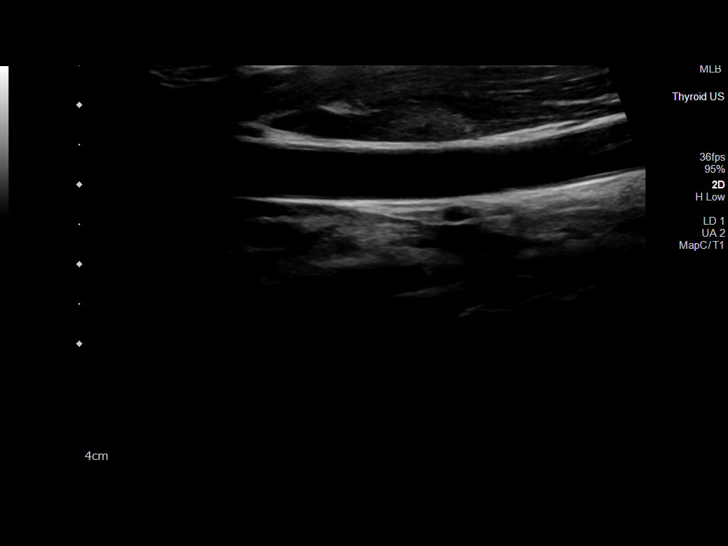
[im 28/55]
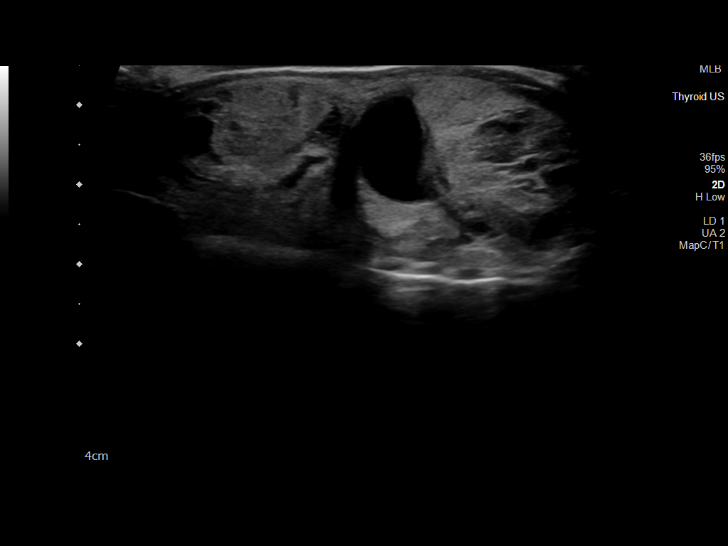
[im 32/55]
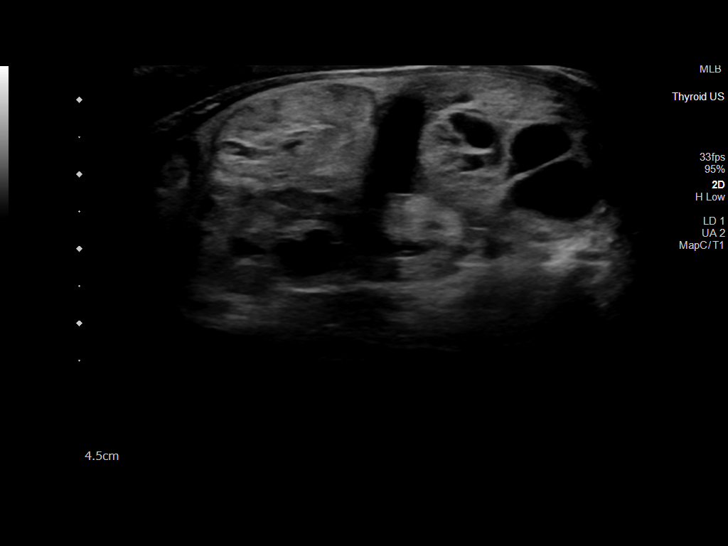
[im 37/55]
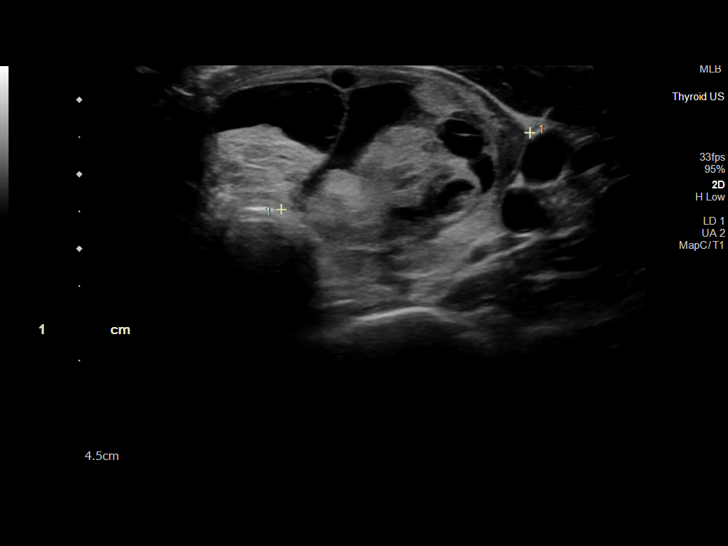
[im 41/55]
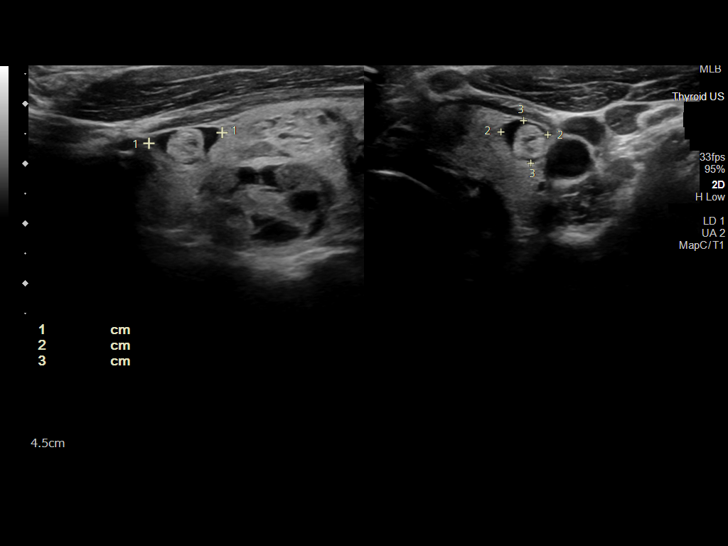
[im 46/55]
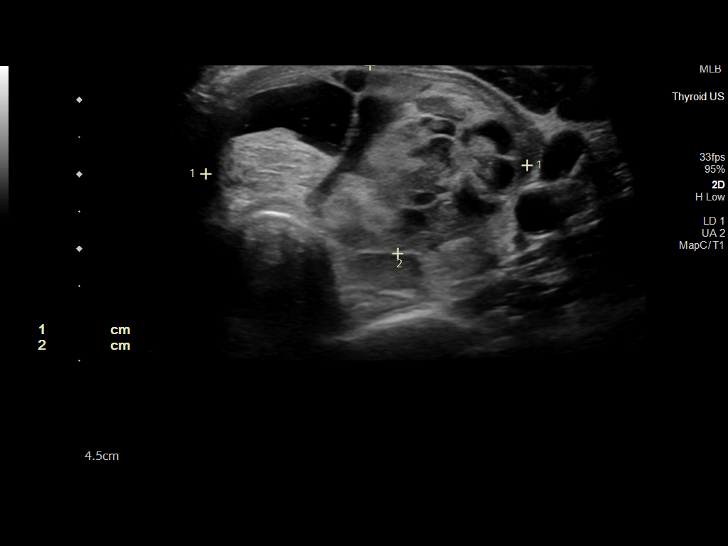
[im 50/55]
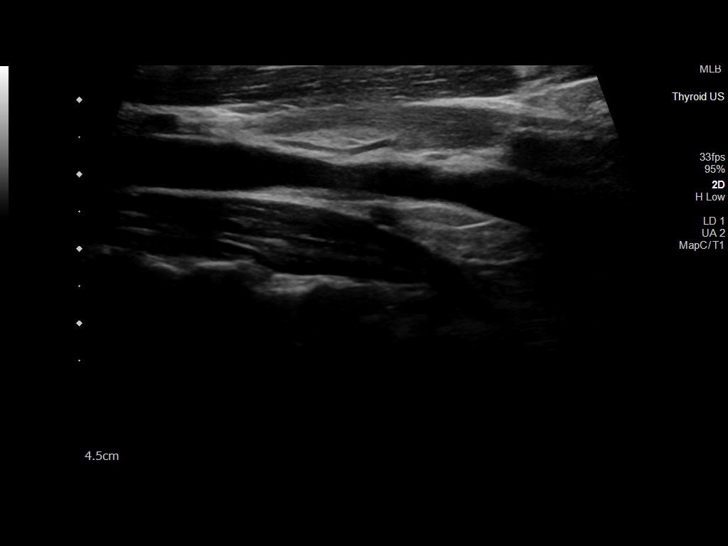
[im 55/55]
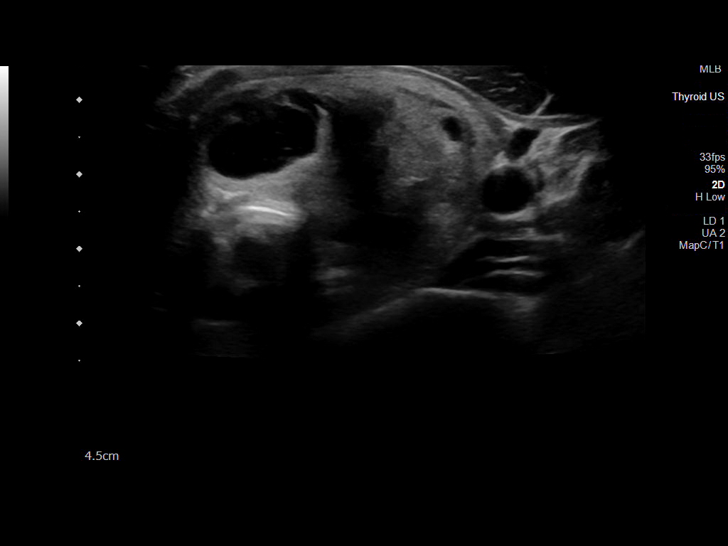

[13 of 25 positions shown; findings below may reference images not displayed]

FINDINGS: Parenchymal Echotexture: Markedly heterogenous

Isthmus: 0.5 cm thickness

Right lobe: 5.9 x 2.5 x 2.2 cm

Left lobe: 7 x 2.9 x 3.5 cm

_________________________________________________________

Estimated total number of nodules >/= 1 cm: 3

Number of spongiform nodules >/=  2 cm not described below (TR1): 0

Number of mixed cystic and solid nodules >/= 1.5 cm not described
below (TR2): 0

_________________________________________________________

Nodule 1: 0.7 cm hypoechoic nodule without calcifications, superior
right; This nodule does NOT meet TI-RADS criteria for biopsy or
dedicated follow-up.

Nodule # 2:

Location: Right; inferior

Maximum size: 3.5 cm; Other 2 dimensions: 2.5 x 2.1 cm

Composition: solid/almost completely solid (2)

Echogenicity: hypoechoic (2)

Shape: not taller-than-wide (0)

Margins: smooth (0)

Echogenic foci: none (0)

ACR TI-RADS total points: 4.

ACR TI-RADS risk category: TR 4.

ACR TI-RADS recommendations:

**Given size (>/= 1.5 cm) and appearance, fine needle aspiration of
this moderately suspicious nodule should be considered based on
TI-RADS criteria.

_________________________________________________________

Nodule # 3:

Location: Left; superior

Maximum size: 1.2 cm; Other 2 dimensions: 0.8 x 0.7 cm

Composition: mixed cystic and solid (1)

Echogenicity: isoechoic (1)

Shape: not taller-than-wide (0)

Margins: smooth (0)

Echogenic foci: none (0)

ACR TI-RADS total points: 2.

ACR TI-RADS risk category: TR 2.

ACR TI-RADS recommendations:

This nodule does NOT meet TI-RADS criteria for biopsy or dedicated
follow-up.

_________________________________________________________

Nodule # 4:

Location: Left; inferior

Maximum size: 5.9 cm; Other 2 dimensions: 4.3 x 2.6 cm

Composition: mixed cystic and solid (1)

Echogenicity: isoechoic (1)

Shape: not taller-than-wide (0)

Margins: ill-defined (0)

Echogenic foci: none (0)

ACR TI-RADS total points: 2.

ACR TI-RADS risk category: TR 2.

ACR TI-RADS recommendations:

This nodule does NOT meet TI-RADS criteria for biopsy or dedicated
follow-up.

_________________________________________________________

No regional cervical adenopathy.
IMPRESSION: 1. Thyromegaly with bilateral nodules.
2. Recommend FNA biopsy of moderately suspicious 3.5 cm inferior
right nodule.

The above is in keeping with the ACR TI-RADS recommendations - [HOSPITAL] 3876;[DATE].

## 2023-02-12 ENCOUNTER — Ambulatory Visit
Admission: RE | Admit: 2023-02-12 | Discharge: 2023-02-12 | Disposition: A | Discharge status: Home / Self Care | Payer: BC Managed Care – PPO | Source: Ambulatory Visit | Attending: Otolaryngology | Admitting: Otolaryngology

## 2023-02-12 DIAGNOSIS — E041 Nontoxic single thyroid nodule: Secondary | ICD-10-CM

## 2023-02-12 DIAGNOSIS — E042 Nontoxic multinodular goiter: Secondary | ICD-10-CM | POA: Diagnosis not present

## 2023-04-16 ENCOUNTER — Ambulatory Visit: Payer: BC Managed Care – PPO | Admitting: Physician Assistant

## 2023-07-23 ENCOUNTER — Telehealth: Payer: Self-pay | Admitting: Family

## 2023-07-23 NOTE — Telephone Encounter (Signed)
Advised to see pcp within 2 weeks. Scheduled on 08/04/23  Patient Name First: [name] Last: Mayford [name] Gender: Female DOB: 18-Jan-1986 Age: 37 Y 4 M 25 D Return Phone Number: [phone] (Primary) Address: City/ State/ Zip: [place] [address] Client [facility] - Administrator, [facility] at [facility] Contact Type Call Who Is Calling Patient / Member / Family / Caregiver Call Type Triage / Clinical Relationship To Patient Self Return Phone Number [phone] (Primary) Chief Complaint Heart palpitations or irregular heartbeat Reason for Call Symptomatic / Request for Health Information Initial Comment Caller stats she is calling for a patient that needs triage her heart feels like its beating to fast. CBWN Translation No Nurse Assessment Nurse: Jayme Cloud, RN, [name] Date/Time (Eastern Time): 07/23/2023 2:04:47 PM Confirm and document reason for call. If symptomatic, describe symptoms. ---Caller states she had felt cardiac symptoms, she explains she felt hard thumping from her chest. Was not fast or skipping- she felt for a few seconds while driving. Last felt a few weeks ago, she does not feel it now. Does the patient have any new or worsening symptoms? ---Yes Will a triage be completed? ---Yes Related visit to physician within the last 2 weeks? ---No Does the PT have any chronic conditions? (i.e. diabetes, asthma, this includes High risk factors for pregnancy, etc.) ---No Is the patient pregnant or possibly pregnant? (Ask all females between the ages of 54-55) ---No Is this a behavioral health or substance abuse call? ---No Guidelines Guideline Title Affirmed Question Affirmed Notes Nurse Date/Time (Eastern Time) Heart Rate and Heartbeat Questions Problems with anxiety or stress Hulen Luster 07/23/2023 2:06:51 PM Disp. Time Lamount Cohen Time) Disposition Final User 07/23/2023 2:12:54 PM See PCP  within 2 Weeks Yes Jayme Cloud, RN, [name] Final Disposition 07/23/2023 2:12:54 PM See PCP within 2 Weeks Yes Jayme Cloud, RN, Rodney Cruise Disagree/Comply Comply Caller Understands Yes PreDisposition Did not know what to do Care Advice Given Per Guideline SEE PCP WITHIN 2 WEEKS: * You need to be seen for this ongoing problem within the next 2 weeks. REASSURANCE AND EDUCATION - EXTRA HEARTBEATS AND PALPITATIONS: * People with anxiety or stress may describe a 'rapid heartbeat' or 'pounding' in their chest from their heart beating. AVOID CAFFEINE: * Avoid caffeine-containing beverages. Reason: Caffeine is a stimulant and can aggravate palpitations. * Examples include coffee, tea, colas, [address], [employer], and some 'energy drinks'. CALL BACK IF: * Chest pain, lightheadedness or difficulty breathing occurs * You become worse CARE ADVICE given per Heart Rate and Heartbeat Questions (Adult) guideline. Referrals REFERRED TO PCP OFFICE

## 2023-07-23 NOTE — Telephone Encounter (Signed)
FYI: This call has been transferred to triage nurse: the Triage Nurse. Once the result note has been entered staff can address the message at that time.  Patient called in with the following symptoms:  Red Word:Abnormally Beating Heart ( too fast per pt )    Please advise at Mobile [phone] (mobile)  Message is routed to Provider Pool.

## 2023-07-28 DIAGNOSIS — F4310 Post-traumatic stress disorder, unspecified: Secondary | ICD-10-CM | POA: Diagnosis not present

## 2023-08-04 ENCOUNTER — Ambulatory Visit: Payer: BC Managed Care – PPO | Admitting: Family

## 2023-08-04 ENCOUNTER — Encounter: Payer: Self-pay | Admitting: Family

## 2023-08-04 NOTE — Progress Notes (Deleted)
   Patient ID: [name], female    DOB: Nov 17, 1986, 37 y.o.   MRN: [id number]  No chief complaint on file.   HPI:   Assessment & Plan:  There are no diagnoses linked to this encounter.  Subjective:    Outpatient Medications Prior to Visit  Medication Sig Dispense Refill   norelgestromin-ethinyl estradiol Burr Medico) 150-35 MCG/24HR transdermal patch Place 1 patch onto the skin once a week. 3 patch 12   No facility-administered medications prior to visit.   Past Medical History:  Diagnosis Date   Cesarean delivery delivered 11/17 07/22/2012   Chlamydia ?2007   Heart murmur    Maternal anemia, with delivery 10/10/2017   Postpartum care following cesarean delivery (11/17) 10/09/2017   Urinary tract infection    Vitamin D deficiency    Past Surgical History:  Procedure Laterality Date   CESAREAN SECTION     CESAREAN SECTION  07/20/2012   Procedure: CESAREAN SECTION;  Surgeon: Antionette Char, MD;  Location: [facility];  Service: Gynecology;  Laterality: N/A;   CESAREAN SECTION N/A 10/09/2017   Procedure: Repeat CESAREAN SECTION;  Surgeon: Maxie Better, MD;  [facility];  Service: Obstetrics;  Laterality: N/A;  EDD: 10/14/17   LAPAROSCOPY FOR ECTOPIC PREGNANCY  Feb 2012   SALPINGECTOMY     No Known Allergies    Objective:    Physical Exam Vitals and nursing note reviewed.  Constitutional:      Appearance: Normal appearance.  Cardiovascular:     Rate and Rhythm: Normal rate and regular rhythm.  Pulmonary:     Effort: Pulmonary effort is normal.     Breath sounds: Normal breath sounds.  Musculoskeletal:        General: Normal range of motion.  Skin:    General: Skin is warm and dry.  Neurological:     Mental Status: She is alert.  Psychiatric:        Mood and Affect: Mood normal.        Behavior: Behavior normal.    There were no vitals taken for this visit. Wt Readings from Last 3 Encounters:  12/25/21 143 lb 9.6 oz (65.1 kg)   10/09/17 161 lb (73 kg)  09/30/17 160 lb (72.6 kg)       Dulce Sellar, NP

## 2023-08-11 DIAGNOSIS — F4310 Post-traumatic stress disorder, unspecified: Secondary | ICD-10-CM | POA: Diagnosis not present

## 2023-08-19 DIAGNOSIS — F4310 Post-traumatic stress disorder, unspecified: Secondary | ICD-10-CM | POA: Diagnosis not present

## 2023-11-30 ENCOUNTER — Other Ambulatory Visit: Payer: Self-pay

## 2023-11-30 ENCOUNTER — Encounter: Payer: Self-pay | Admitting: Emergency Medicine

## 2023-11-30 ENCOUNTER — Ambulatory Visit
Admission: EM | Admit: 2023-11-30 | Discharge: 2023-11-30 | Disposition: A | Discharge status: Home / Self Care | Payer: BC Managed Care – PPO | Attending: Family Medicine | Admitting: Family Medicine

## 2023-11-30 DIAGNOSIS — J069 Acute upper respiratory infection, unspecified: Secondary | ICD-10-CM | POA: Diagnosis not present

## 2023-11-30 DIAGNOSIS — L249 Irritant contact dermatitis, unspecified cause: Secondary | ICD-10-CM | POA: Diagnosis not present

## 2023-11-30 MED ORDER — TRIAMCINOLONE ACETONIDE 0.025 % EX OINT: 1.0000 | TOPICAL_OINTMENT | Freq: Three times a day (TID) | CUTANEOUS | 0 refills | Status: DC | PRN

## 2023-11-30 MED ORDER — LEVOCETIRIZINE DIHYDROCHLORIDE 5 MG PO TABS
5.0000 mg | ORAL_TABLET | Freq: Every evening | ORAL | 0 refills | Status: DC
Start: 2023-11-30 — End: 2024-06-08

## 2023-11-30 MED ORDER — PREDNISONE 20 MG PO TABS: 20.0000 mg | ORAL_TABLET | Freq: Every day | ORAL | 0 refills | Status: AC

## 2023-11-30 NOTE — ED Triage Notes (Signed)
 Pt here for hives to neck starting yesterday; pt sts has had URI sx x 3 days; pt denies new meds and sts benadryl helped

## 2023-11-30 NOTE — ED Provider Notes (Signed)
 [facility]    CSN: [id number] Arrival date & time: 11/30/23  0913      History   Chief Complaint Chief Complaint  Patient presents with   Rash    HPI [name] is a 38 y.o. female minimum contributory medical history. Patient reports rash x 2 days.  Rash is localized to neck and upper chest wall. Says that rash is pruritic.  She took benadryl  overnight and reports that she fell asleep however rash remained present upon awakening and has remained itchy.  Has no history of eczema or chronic allergies. Patient reports she has been sick with URI symptoms including cough, nasal congestion and postnasal drainage, headache and  generalized bodyaches for the last 3 days.  She has taken over-the-counter cold cough and flu medication without relief of symptoms.  Past Medical History:  Diagnosis Date   Cesarean delivery delivered 11/17 07/22/2012   Chlamydia ?2007   Heart murmur    Maternal anemia, with delivery 10/10/2017   Postpartum care following cesarean delivery (11/17) 10/09/2017   Urinary tract infection    Vitamin D deficiency     Patient Active Problem List   Diagnosis Date Noted   Encounter for initial prescription of transdermal patch hormonal contraceptive device 12/25/2021    Past Surgical History:  Procedure Laterality Date   CESAREAN SECTION     CESAREAN SECTION  07/20/2012   Procedure: CESAREAN SECTION;  Surgeon: Olam Mill, MD;  Location: [facility];  Service: Gynecology;  Laterality: N/A;   CESAREAN SECTION N/A 10/09/2017   Procedure: Repeat CESAREAN SECTION;  Surgeon: Rutherford Gain, MD;  Location: [facility];  Service: Obstetrics;  Laterality: N/A;  EDD: 10/14/17   LAPAROSCOPY FOR ECTOPIC PREGNANCY  Feb 2012   SALPINGECTOMY      OB History     Gravida  4   Para  2   Term  1   Preterm  1   AB  1   Living  3      SAB  0   IAB  0   Ectopic  1   Multiple  1   Live Births  2            Home  Medications    Prior to Admission medications   Medication Sig Start Date End Date Taking? Authorizing Provider  levocetirizine (XYZAL ) 5 MG tablet Take 1 tablet (5 mg total) by mouth every evening for 14 days. 11/30/23 12/14/23 Yes Arloa Suzen RAMAN, NP  predniSONE  (DELTASONE ) 20 MG tablet Take 1 tablet (20 mg total) by mouth daily with breakfast for 5 days. 11/30/23 12/05/23 Yes Arloa Suzen RAMAN, NP  triamcinolone  (KENALOG ) 0.025 % ointment Apply 1 Application topically 3 (three) times daily as needed (skin rash). 11/30/23  Yes Arloa Suzen RAMAN, NP  norelgestromin -ethinyl estradiol  (XULANE) 150-35 MCG/24HR transdermal patch Place 1 patch onto the skin once a week. 12/25/21   Lucius Krabbe, NP    Family History Family History  Problem Relation Age of Onset   Hyperlipidemia Father    Hypertension Father    Depression Maternal Grandmother    Anesthesia problems Neg Hx    Hypotension Neg Hx    Malignant hyperthermia Neg Hx    Pseudochol deficiency Neg Hx    Other Neg Hx     Social History Social History   Tobacco Use   Smoking status: Former    Current packs/day: 0.00    Types: Cigarettes    Quit date: 12/06/2011  Years since quitting: 11.9   Smokeless tobacco: Never  Substance Use Topics   Alcohol use: Yes    Alcohol/week: 5.0 standard drinks of alcohol    Types: 5 Glasses of wine per week   Drug use: No     Allergies   Patient has no known allergies.   Review of Systems Review of Systems  Skin:  Positive for rash.     Physical Exam Triage Vital Signs ED Triage Vitals  Encounter Vitals Group     BP 11/30/23 1005 114/67     Systolic BP Percentile --      Diastolic BP Percentile --      Pulse Rate 11/30/23 1005 85     Resp 11/30/23 1005 18     Temp 11/30/23 1005 98.5 F (36.9 C)     Temp Source 11/30/23 1005 Oral     SpO2 11/30/23 1005 98 %     Weight --      Height --      Head Circumference --      Peak Flow --      Pain Score 11/30/23 1006 0      Pain Loc --      Pain Education --      Exclude from Growth Chart --    No data found.  Updated Vital Signs BP 114/67 (BP Location: Left Arm)   Pulse 85   Temp 98.5 F (36.9 C) (Oral)   Resp 18   SpO2 98%   Visual Acuity Right Eye Distance:   Left Eye Distance:   Bilateral Distance:    Right Eye Near:   Left Eye Near:    Bilateral Near:     Physical Exam Vitals reviewed.  Constitutional:      Appearance: Normal appearance.  HENT:     Head: Normocephalic.     Nose: Congestion and rhinorrhea present.     Mouth/Throat:     Pharynx: Postnasal drip present. No oropharyngeal exudate.  Eyes:     Extraocular Movements: Extraocular movements intact.     Pupils: Pupils are equal, round, and reactive to light.  Cardiovascular:     Rate and Rhythm: Normal rate and regular rhythm.  Pulmonary:     Effort: Pulmonary effort is normal.     Breath sounds: Normal breath sounds.  Musculoskeletal:     Cervical back: Normal range of motion and neck supple.  Skin:    General: Skin is warm.     Findings: Erythema and rash present. Rash is macular.       Neurological:     General: No focal deficit present.     Mental Status: She is alert.      UC Treatments / Results  Labs (all labs ordered are listed, but only abnormal results are displayed) Labs Reviewed - No data to display  EKG   Radiology No results found.  Procedures Procedures (including critical care time)  Medications Ordered in UC Medications - No data to display  Initial Impression / Assessment and Plan / UC Course  I have reviewed the triage vital signs and the nursing notes.  Pertinent labs & imaging results that were available during my care of the patient were reviewed by me and considered in my medical decision making (see chart for details).    Irritant contact dermatitis of unknown etiology, treatment with prednisone  20 mg daily for 5 days and triamcinolone  cream 3 times daily as needed for  rash. Viral URI, no fever low  suspicion for COVID or FLU, symptom management warranted only.  Symptoms may likely improve with prednisone  that is currently being prescribed for irritant dermatitis.  Levocetirizine prescribed for postnasal drainage and urticaria.  Return precautions given if symptoms worsen or do not improve.  Work note provided. Final Clinical Impressions(s) / UC Diagnoses   Final diagnoses:  Irritant contact dermatitis, unspecified trigger  Viral URI   Discharge Instructions   None    ED Prescriptions     Medication Sig Dispense Auth. Provider   triamcinolone  (KENALOG ) 0.025 % ointment Apply 1 Application topically 3 (three) times daily as needed (skin rash). 160 g Arloa Suzen RAMAN, NP   predniSONE  (DELTASONE ) 20 MG tablet Take 1 tablet (20 mg total) by mouth daily with breakfast for 5 days. 5 tablet Arloa Suzen RAMAN, NP   levocetirizine (XYZAL ) 5 MG tablet Take 1 tablet (5 mg total) by mouth every evening for 14 days. 14 tablet Arloa Suzen RAMAN, NP      PDMP not reviewed this encounter.   Arloa Suzen RAMAN, NP 11/30/23 1109

## 2024-01-24 ENCOUNTER — Encounter: Payer: Self-pay | Admitting: Family

## 2024-02-04 ENCOUNTER — Encounter: Payer: Self-pay | Admitting: Family

## 2024-02-04 ENCOUNTER — Other Ambulatory Visit (HOSPITAL_COMMUNITY)
Admission: RE | Admit: 2024-02-04 | Discharge: 2024-02-04 | Disposition: A | Discharge status: Home / Self Care | Source: Ambulatory Visit | Attending: Family | Admitting: Family

## 2024-02-04 ENCOUNTER — Ambulatory Visit: Admitting: Family

## 2024-02-04 VITALS — BP 110/76 | HR 70 | Temp 98.2°F | Ht 62.0 in | Wt 160.8 lb

## 2024-02-04 DIAGNOSIS — N898 Other specified noninflammatory disorders of vagina: Secondary | ICD-10-CM | POA: Insufficient documentation

## 2024-02-04 DIAGNOSIS — N760 Acute vaginitis: Secondary | ICD-10-CM

## 2024-02-04 DIAGNOSIS — B9689 Other specified bacterial agents as the cause of diseases classified elsewhere: Secondary | ICD-10-CM | POA: Diagnosis not present

## 2024-02-04 DIAGNOSIS — F40243 Fear of flying: Secondary | ICD-10-CM

## 2024-02-04 MED ORDER — LORAZEPAM 0.5 MG PO TABS: 0.5000 mg | ORAL_TABLET | Freq: Every day | ORAL | 0 refills | Status: AC | PRN
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
## 2024-02-07 ENCOUNTER — Encounter: Payer: Self-pay | Admitting: Family

## 2024-02-07 LAB — CERVICOVAGINAL ANCILLARY ONLY
Bacterial Vaginitis (gardnerella): POSITIVE — AB
Candida Glabrata: NEGATIVE
Candida Vaginitis: NEGATIVE
Chlamydia: NEGATIVE
Comment: NEGATIVE
Comment: NEGATIVE
Comment: NEGATIVE
Comment: NEGATIVE
Comment: NEGATIVE
Comment: NORMAL
Neisseria Gonorrhea: NEGATIVE
Trichomonas: NEGATIVE

## 2024-02-07 MED ORDER — METRONIDAZOLE 500 MG PO TABS: 500.0000 mg | ORAL_TABLET | Freq: Two times a day (BID) | ORAL | 0 refills | Status: AC

## 2024-02-07 NOTE — Addendum Note (Signed)
 Addended byDulce Sellar on: 02/07/2024 04:57 PM   Modules accepted: Orders

## 2024-04-14 ENCOUNTER — Encounter: Payer: Self-pay | Admitting: Family

## 2024-05-09 ENCOUNTER — Ambulatory Visit (INDEPENDENT_AMBULATORY_CARE_PROVIDER_SITE_OTHER): Admitting: Family

## 2024-05-09 VITALS — BP 128/82 | HR 88 | Temp 98.2°F | Ht 60.0 in | Wt 169.6 lb

## 2024-05-09 DIAGNOSIS — Z6833 Body mass index (BMI) 33.0-33.9, adult: Secondary | ICD-10-CM | POA: Diagnosis not present

## 2024-05-09 DIAGNOSIS — E6609 Other obesity due to excess calories: Secondary | ICD-10-CM | POA: Diagnosis not present

## 2024-05-09 DIAGNOSIS — Z13220 Encounter for screening for lipoid disorders: Secondary | ICD-10-CM

## 2024-05-09 DIAGNOSIS — Z0000 Encounter for general adult medical examination without abnormal findings: Secondary | ICD-10-CM

## 2024-05-09 DIAGNOSIS — Z30016 Encounter for initial prescription of transdermal patch hormonal contraceptive device: Secondary | ICD-10-CM

## 2024-05-09 DIAGNOSIS — E66811 Obesity, class 1: Secondary | ICD-10-CM | POA: Diagnosis not present

## 2024-05-09 LAB — LIPID PANEL
Cholesterol: 165 mg/dL (ref 0–200)
HDL: 59.8 mg/dL (ref >39.00)
LDL Cholesterol: 97 mg/dL (ref 0–99)
NonHDL: 105.52
Total CHOL/HDL Ratio: 3
Triglycerides: 43 mg/dL (ref 0.0–149.0)
VLDL: 8.6 mg/dL (ref 0.0–40.0)

## 2024-05-09 LAB — CBC WITH DIFFERENTIAL/PLATELET
Basophils Absolute: 0 10*3/uL (ref 0.0–0.1)
Basophils Relative: 0.2 % (ref 0.0–3.0)
Eosinophils Absolute: 0.1 10*3/uL (ref 0.0–0.7)
Eosinophils Relative: 1.8 % (ref 0.0–5.0)
HCT: 37.5 % (ref 36.0–46.0)
Hemoglobin: 12.6 g/dL (ref 12.0–15.0)
Lymphocytes Relative: 43.1 % (ref 12.0–46.0)
Lymphs Abs: 1.9 10*3/uL (ref 0.7–4.0)
MCHC: 33.6 g/dL (ref 30.0–36.0)
MCV: 90.7 fl (ref 78.0–100.0)
Monocytes Absolute: 0.5 10*3/uL (ref 0.1–1.0)
Monocytes Relative: 11.4 % (ref 3.0–12.0)
Neutro Abs: 1.9 10*3/uL (ref 1.4–7.7)
Neutrophils Relative %: 43.5 % (ref 43.0–77.0)
Platelets: 256 10*3/uL (ref 150.0–400.0)
RBC: 4.13 Mil/uL (ref 3.87–5.11)
RDW: 13.3 % (ref 11.5–15.5)
WBC: 4.3 10*3/uL (ref 4.0–10.5)

## 2024-05-09 LAB — COMPLETE METABOLIC PANEL WITHOUT GFR
AG Ratio: 1.3 (calc) (ref 1.0–2.5)
ALT: 6 U/L (ref 6–29)
AST: 11 U/L (ref 10–30)
Albumin: 3.9 g/dL (ref 3.6–5.1)
Alkaline phosphatase (APISO): 62 U/L (ref 31–125)
BUN: 8 mg/dL (ref 7–25)
CO2: 23 mmol/L (ref 20–32)
Calcium: 8.8 mg/dL (ref 8.6–10.2)
Chloride: 105 mmol/L (ref 98–110)
Creat: 0.71 mg/dL (ref 0.50–0.97)
Globulin: 2.9 g/dL (ref 1.9–3.7)
Glucose, Bld: 93 mg/dL (ref 65–99)
Potassium: 4.3 mmol/L (ref 3.5–5.3)
Sodium: 137 mmol/L (ref 135–146)
Total Bilirubin: 0.7 mg/dL (ref 0.2–1.2)
Total Protein: 6.8 g/dL (ref 6.1–8.1)

## 2024-05-09 LAB — TSH: TSH: 1.33 u[IU]/mL (ref 0.35–5.50)

## 2024-05-09 MED ORDER — NORELGESTROMIN-ETH ESTRADIOL 150-35 MCG/24HR TD PTWK: 1.0000 | MEDICATED_PATCH | TRANSDERMAL | 12 refills | Status: AC

## 2024-05-09 NOTE — Assessment & Plan Note (Signed)
 She would like to resume Xulane patch for contraception after a year off. Previously used solely for contraception. She and her husband aim to prevent pregnancy. - Prescribe Xulane patch. - Send prescription for three-month supply with 3 refills. - F/U in 1 year

## 2024-05-09 NOTE — Progress Notes (Signed)
 Phone [phone]  Subjective:   Patient is a 38 y.o. female presenting for annual physical.    Chief Complaint  Patient presents with   Annual Exam    Patient wants to try and get back on birth control.   Discussed the use of AI scribe software for clinical note transcription with the patient, who gave verbal consent to proceed.  History of Present Illness [name] is a 38 year old female who presents for an annual physical exam and to restart birth control.  She plans to resume the Xulane patch for contraception after a year-long break, having used it previously for birth control. She and her husband are not planning to conceive currently. She consumes a glass of wine nightly and does not smoke or vape. She finds it challenging to maintain a consistent exercise routine, previously walking an hour daily and doing home exercises. Her weight has remained stable since she stopped smoking, which she acknowledges previously suppressed her appetite. It has been five years since her last Pap smear, and she is due for another.  See problem oriented charting- ROS- full  review of systems was completed and negative except for what is noted in HPI above.  The following were reviewed and entered/updated in epic: Past Medical History:  Diagnosis Date   Cesarean delivery delivered 11/17 07/22/2012   Chlamydia ?2007   Heart murmur    Maternal anemia, with delivery 10/10/2017   Postpartum care following cesarean delivery (11/17) 10/09/2017   Urinary tract infection    Vitamin D deficiency    Patient Active Problem List   Diagnosis Date Noted   Class 1 obesity due to excess calories without serious comorbidity with body mass index (BMI) of 33.0 to 33.9 in adult 05/09/2024   Encounter for initial prescription of transdermal patch hormonal contraceptive device 12/25/2021   Past Surgical History:  Procedure Laterality Date   CESAREAN SECTION     CESAREAN SECTION  07/20/2012    Procedure: CESAREAN SECTION;  Surgeon: Abdul Hodgkin, MD;  Location: [facility];  Service: Gynecology;  Laterality: N/A;   CESAREAN SECTION N/A 10/09/2017   Procedure: Repeat CESAREAN SECTION;  Surgeon: Ivery Marking, MD;  Location: [facility] SU[facility];  Service: Obstetrics;  Laterality: N/A;  EDD: 10/14/17   LAPAROSCOPY FOR ECTOPIC PREGNANCY  Feb 2012   SALPINGECTOMY      Family History  Problem Relation Age of Onset   Hyperlipidemia Father    Hypertension Father    Depression Maternal Grandmother    Anesthesia problems Neg Hx    Hypotension Neg Hx    Malignant hyperthermia Neg Hx    Pseudochol deficiency Neg Hx    Other Neg Hx     Medications- reviewed and updated Current Outpatient Medications  Medication Sig Dispense Refill   LORazepam  (ATIVAN ) 0.5 MG tablet Take 1-2 tablets (0.5-1 mg total) by mouth daily as needed for anxiety. 6 tablet 0   levocetirizine (XYZAL ) 5 MG tablet Take 1 tablet (5 mg total) by mouth every evening for 14 days. (Patient not taking: Reported on 05/09/2024) 14 tablet 0   norelgestromin -ethinyl estradiol  (XULANE) 150-35 MCG/24HR transdermal patch Place 1 patch onto the skin once a week. 3 patch 12   triamcinolone  (KENALOG ) 0.025 % ointment Apply 1 Application topically 3 (three) times daily as needed (skin rash). (Patient not taking: Reported on 05/09/2024) 160 g 0   No current facility-administered medications for this visit.    Allergies-reviewed and updated No Known Allergies  Social History  Social History Narrative   Not on file    Objective:  BP 128/82   Pulse 88   Temp 98.2 F (36.8 C) (Temporal)   Ht 5' (1.524 m)   Wt 169 lb 9.6 oz (76.9 kg)   SpO2 99%   BMI 33.12 kg/m  Physical Exam Vitals and nursing note reviewed.  Constitutional:      Appearance: Normal appearance. She is obese.  HENT:     Head: Normocephalic.     Right Ear: Tympanic membrane normal.     Left Ear: Tympanic membrane normal.     Nose: Nose normal.      Mouth/Throat:     Mouth: Mucous membranes are moist.   Eyes:     Pupils: Pupils are equal, round, and reactive to light.    Cardiovascular:     Rate and Rhythm: Normal rate and regular rhythm.  Pulmonary:     Effort: Pulmonary effort is normal.     Breath sounds: Normal breath sounds.   Musculoskeletal:        General: Normal range of motion.     Cervical back: Normal range of motion.  Lymphadenopathy:     Cervical: No cervical adenopathy.   Skin:    General: Skin is warm and dry.   Neurological:     Mental Status: She is alert.   Psychiatric:        Mood and Affect: Mood normal.        Behavior: Behavior normal.     Assessment and Plan   Health Maintenance counseling: 1. Anticipatory guidance: Patient counseled regarding regular dental exams q6 months, eye exams,  avoiding smoking and second hand smoke, limiting alcohol to 1 beverage per day, no illicit drugs.   2. Risk factor reduction:  Advised patient of need for regular exercise and diet rich with fruits and vegetables to reduce risk of heart attack and stroke. Wt Readings from Last 3 Encounters:  05/09/24 169 lb 9.6 oz (76.9 kg)  02/04/24 160 lb 12.8 oz (72.9 kg)  12/25/21 143 lb 9.6 oz (65.1 kg)   3. Immunizations/screenings/ancillary studies Immunization History  Administered Date(s) Administered   Rho (D) Immune Globulin  11/12/2011, 04/18/2012, 07/21/2012   Health Maintenance Due  Topic Date Due   HPV VACCINES (1 - 3-dose series) Never done    4. Cervical cancer screening: est w/GYN - will call to sch 5. Skin cancer screening- advised regular sunscreen use. Denies worrisome, changing, or new skin lesions.  6. Birth control/STD check: Xulane 7. Smoking associated screening: non- smoker 8. Alcohol screening: glass of wine per night   Assessment & Plan Contraceptive Management She would like to resume Xulane patch for contraception after a year off. Previously used solely for contraception. She and her  husband aim to prevent pregnancy. - Prescribe Xulane patch. - Send prescription for three-month supply with 3 refills. - F/U in 1 year  Weight Management She struggles with consistent exercise and weight gain post-smoking cessation. Aware of exercise's role in weight maintenance. Consumes a glass of wine nightly, aware of caloric content. Emphasized caloric intake's role in weight loss. Provided dietary and lifestyle recommendations. - Refer to weight loss clinic. - Advise on caloric intake reduction and dietary changes, including avoiding white carbohydrates and increasing fiber intake. - Encourage use of calorie tracking apps. - Recommend regular exercise, even if for short durations. - Will continue to follow  General Health Maintenance Due for Pap smear, unsure of last date. No skin lesion concerns, completed  STI testing. Does not smoke or vape, consumes alcohol socially. Advised on alcohol risks and benefits of hydration and balanced meals. - Order complete CPE blood panel - Advise scheduling a Pap smear with GYN. - Encourage reduction of alcohol consumption. - Advise on adequate hydration with at least two liters of water daily. - Recommend balanced meals with larger meals earlier in the day and smaller, protein-rich dinners.  Follow-up Lab results pending, may take up to a week. Referred to weight loss clinic for further management. - Contact with lab results within a week. - Ensure referral to weight loss clinic is processed and follow up if not contacted within a week.  Recommended follow up:  Return for any future concerns, Complete physical w/fasting labs. No future appointments.  Lab/Order associations: fasting    Versa Gore, NP

## 2024-05-09 NOTE — Assessment & Plan Note (Signed)
 She struggles with consistent exercise and weight gain post-smoking cessation. Aware of exercise's role in weight maintenance. Consumes a glass of wine nightly, aware of caloric content. Emphasized caloric intake's role in weight loss. Provided dietary and lifestyle recommendations. - Refer to weight loss clinic. - Advise on caloric intake reduction and dietary changes, including avoiding white carbohydrates and increasing fiber intake. - Encourage use of calorie tracking apps. - Recommend regular exercise, even if for short durations. - Will continue to follow

## 2024-05-09 NOTE — Patient Instructions (Addendum)
 It was very nice to see you today!  I will review your lab results via MyChart in a few days.  I have sent over a referral to our healthy weight and wellness clinic.  Stay well! Have a great summer! Start working on what we discussed today regarding your weight loss goals:  1) Eat small portions, ok to eat 6 mini meals vs. 3 big meals if this controls your hunger  better. 2) Eat until full and then STOP! This is your body telling you it has had enough. 3) Drink at least 64 oz water = 2 liters = 8, 8oz cups DAILY. 4) Eat most of your calories earlier in the day (if you work during the day).  Want to eat within a 8-10hour window if possible. No eating after 7pm, only no calorie drinks or water from 7p until bedtime. 5) Look for low carb, high protein foods/recipes. Avoid simple carbs including sweets, white bread, rice, potatoes, pasta. Look for whole grain/whole wheat/or almond flour if eating these foods. 6) High protein foods: meats (limit red meat), including [place], chicken, pork, FISH (best source) nuts, cheese, beans (black beans, soy/edamame beans are best). Protein drinks, bars are also good but must have low sugar, look for < 10 grams. 7) Avoid processed/refined sugar and artificial sweeteners if possible. Stevia, Erythritol, or Monk fruit sweetener are preferred, natural, no calorie sweeteners.  Natural sweeteners like Agave or honey in very small amounts are ok also.       PLEASE NOTE:  If you had any lab tests please let us  know if you have not heard back within a few days. You may see your results on MyChart before we have a chance to review them but we will give you a call once they are reviewed by us . If we ordered any referrals today, please let us  know if you have not heard from their office within the next week.

## 2024-05-10 ENCOUNTER — Encounter (INDEPENDENT_AMBULATORY_CARE_PROVIDER_SITE_OTHER): Payer: Self-pay

## 2024-05-10 ENCOUNTER — Ambulatory Visit: Payer: Self-pay | Admitting: Family

## 2024-06-04 ENCOUNTER — Encounter: Payer: Self-pay | Admitting: Family

## 2024-06-08 ENCOUNTER — Telehealth (INDEPENDENT_AMBULATORY_CARE_PROVIDER_SITE_OTHER): Admitting: Family

## 2024-06-08 DIAGNOSIS — F411 Generalized anxiety disorder: Secondary | ICD-10-CM | POA: Diagnosis not present

## 2024-06-08 MED ORDER — HYDROXYZINE HCL 10 MG PO TABS: 10.0000 mg | ORAL_TABLET | Freq: Three times a day (TID) | ORAL | 0 refills | Status: AC | PRN

## 2024-06-08 NOTE — Progress Notes (Signed)
 MyChart Video Visit    Virtual Visit via Video Note   This format is felt to be most appropriate for this patient at this time. Physical exam was limited by quality of the video and audio technology used for the visit. CMA was able to get the patient set up on a video visit.  Patient location: Home. Patient and provider in visit Provider location: Office  I discussed the limitations of evaluation and management by telemedicine and the availability of in person appointments. The patient expressed understanding and agreed to proceed.  Visit Date: 06/08/2024  Today's healthcare provider: Lucius Krabbe, NP     Subjective:   Patient ID: [name], female    DOB: Dec 19, 1985, 38 y.o.   MRN: [id number]  Chief Complaint  Patient presents with   Mental Health Problem    Pt seen today to talk about anxiety; she thought it was due to flying recently but it hasn't gotten any better;   Discussed the use of AI scribe software for clinical note transcription with the patient, who gave verbal consent to proceed.  History of Present Illness [name] is a 37 year old female who presents with anxiety and overthinking.  Anxiety and intrusive thoughts - Persistent anxiety with frequent overthinking and fear of dying or fear of family member dying - Symptoms often triggered by external stimuli such as news articles or social media posts - No recent health issues in immediate family - No use of medication for anxiety due to concerns about potential personality changes  Seasonal affective symptoms - Seasonal depression with increased symptoms during certain times of the year - Seeks counseling during periods of increased symptoms - Established with a therapist and plans to schedule appointments around known trigger times  Assessment & Plan Generalized Anxiety Disorder Persistent anxiety with overthinking, fear of dying, and excessive worry. Seasonal depression noted.  Discussed medication options with minimal personality impact. - Encouraged contacting therapist for counseling. - Recommended lifestyle modifications: regular exercise, yoga, meditation, deep breathing, & dietary changes. - Prescribed hydroxyzine  10mg  tid as needed for anxiety, starting at lowest dose, discussed possible SE. - Consider buspirone if hydroxyzine  ineffective. - Schedule follow-up in one month to assess progress.  General Health Maintenance Emphasized lifestyle modifications for anxiety management and overall health. - Encouraged regular cardio exercise and yoga. - Advised dietary changes to eliminate white carbs and sugar.   Past Medical History:  Diagnosis Date   Cesarean delivery delivered 11/17 07/22/2012   Chlamydia ?2007   Heart murmur    Maternal anemia, with delivery 10/10/2017   Postpartum care following cesarean delivery (11/17) 10/09/2017   Urinary tract infection    Vitamin D deficiency     Past Surgical History:  Procedure Laterality Date   CESAREAN SECTION     CESAREAN SECTION  07/20/2012   Procedure: CESAREAN SECTION;  Surgeon: Olam Mill, MD;  Location: [facility];  Service: Gynecology;  Laterality: N/A;   CESAREAN SECTION N/A 10/09/2017   Procedure: Repeat CESAREAN SECTION;  Surgeon: Rutherford Gain, MD;  Location: [facility];  Service: Obstetrics;  Laterality: N/A;  EDD: 10/14/17   LAPAROSCOPY FOR ECTOPIC PREGNANCY  Feb 2012   SALPINGECTOMY      Outpatient Medications Prior to Visit  Medication Sig Dispense Refill   LORazepam  (ATIVAN ) 0.5 MG tablet Take 1-2 tablets (0.5-1 mg total) by mouth daily as needed for anxiety. 6 tablet 0   norelgestromin -ethinyl estradiol  (XULANE) 150-35 MCG/24HR transdermal patch Place 1 patch  onto the skin once a week. 3 patch 12   levocetirizine (XYZAL ) 5 MG tablet Take 1 tablet (5 mg total) by mouth every evening for 14 days. (Patient not taking: Reported on 06/08/2024) 14 tablet 0   triamcinolone   (KENALOG ) 0.025 % ointment Apply 1 Application topically 3 (three) times daily as needed (skin rash). (Patient not taking: Reported on 06/08/2024) 160 g 0   No facility-administered medications prior to visit.    No Known Allergies     Objective:   Physical Exam Vitals and nursing note reviewed.  Constitutional:      General: Pt is not in acute distress.    Appearance: Normal appearance.  HENT:     Head: Normocephalic.  Pulmonary:     Effort: No respiratory distress.  Musculoskeletal:     Cervical back: Normal range of motion.  Skin:    General: Skin is dry.     Coloration: Skin is not pale.  Neurological:     Mental Status: Pt is alert and oriented to person, place, and time.  Psychiatric:        Mood and Affect: Mood normal.   There were no vitals taken for this visit.  Wt Readings from Last 3 Encounters:  05/09/24 169 lb 9.6 oz (76.9 kg)  02/04/24 160 lb 12.8 oz (72.9 kg)  12/25/21 143 lb 9.6 oz (65.1 kg)      I discussed the assessment and treatment plan with the patient. The patient was provided an opportunity to ask questions and all were answered. The patient agreed with the plan and demonstrated an understanding of the instructions.   The patient was advised to call back or seek an in-person evaluation if the symptoms worsen or if the condition fails to improve as anticipated.  Lucius Krabbe, NP [facility] at [facility] [phone] (phone) [phone] (fax)  [facility]

## 2024-06-27 ENCOUNTER — Encounter: Payer: Self-pay | Admitting: Family

## 2024-06-27 ENCOUNTER — Ambulatory Visit: Admitting: Family

## 2024-06-27 VITALS — BP 106/71 | HR 80 | Temp 97.9°F | Ht 60.0 in | Wt 173.0 lb

## 2024-06-27 DIAGNOSIS — F411 Generalized anxiety disorder: Secondary | ICD-10-CM

## 2024-06-27 MED ORDER — BUSPIRONE HCL 5 MG PO TABS: 5.0000 mg | ORAL_TABLET | ORAL | 1 refills | Status: AC

## 2024-06-27 NOTE — Progress Notes (Signed)
 Patient ID: [name], female    DOB: May 15, 1986, 38 y.o.   MRN: [id number]  Chief Complaint  Patient presents with   Anxiety    Hydroxyzine  caused anxiety to worsen and nightmares.   Discussed the use of AI scribe software for clinical note transcription with the patient, who gave verbal consent to proceed.  History of Present Illness [name] is a 37 year old female who presents with anxiety and adverse reactions to hydroxyzine .  Anxiety symptoms - Experiencing ongoing anxiety, currently exacerbated by life stressors including four children returning to school - Utilizing non-pharmacologic interventions such as meditation, exercise, and yoga for symptom management  Adverse reactions to hydroxyzine  - Develops severe nightmares after taking hydroxyzine  - Initially attributed daytime sleepiness to fatigue, but symptoms persisted with continued use - Attempted to use hydroxyzine  for sleep, but nightmares continued  Assessment & Plan Anxiety disorder Previous treatment with hydroxyzine  caused drowsiness and nightmares. - Switch to buspirone  5 mg once daily, may increase up to three times daily as needed. - Buspirone  less likely to cause drowsiness or nightmares, not addictive. Possible side effects: dizziness, nausea. - Encouraged lifestyle modifications: exercise, yoga, meditation. - Provided three-month prescription, instructed to contact if more frequent dosing needed. - F/U in 3 months   Subjective:    Outpatient Medications Prior to Visit  Medication Sig Dispense Refill   LORazepam  (ATIVAN ) 0.5 MG tablet Take 1-2 tablets (0.5-1 mg total) by mouth daily as needed for anxiety. 6 tablet 0   norelgestromin -ethinyl estradiol  (XULANE) 150-35 MCG/24HR transdermal patch Place 1 patch onto the skin once a week. 3 patch 12   hydrOXYzine  (ATARAX ) 10 MG tablet Take 1 tablet (10 mg total) by mouth 3 (three) times daily as needed for anxiety (May cause drowsiness.). Start  with 1 pill in the morning and then add as needed. Or can take after work to wind down. (Patient not taking: Reported on 06/27/2024) 30 tablet 0   No facility-administered medications prior to visit.   Past Medical History:  Diagnosis Date   Cesarean delivery delivered 11/17 07/22/2012   Chlamydia ?2007   Heart murmur    Maternal anemia, with delivery 10/10/2017   Postpartum care following cesarean delivery (11/17) 10/09/2017   Urinary tract infection    Vitamin D deficiency    Past Surgical History:  Procedure Laterality Date   CESAREAN SECTION     CESAREAN SECTION  07/20/2012   Procedure: CESAREAN SECTION;  Surgeon: Olam Mill, MD;  Location: [facility];  Service: Gynecology;  Laterality: N/A;   CESAREAN SECTION N/A 10/09/2017   Procedure: Repeat CESAREAN SECTION;  Surgeon: Rutherford Gain, MD;  Location: [facility]TH[facility] SU[facility];  Service: Obstetrics;  Laterality: N/A;  EDD: 10/14/17   LAPAROSCOPY FOR ECTOPIC PREGNANCY  Feb 2012   SALPINGECTOMY     No Known Allergies    Objective:    Physical Exam Vitals and nursing note reviewed.  Constitutional:      Appearance: Normal appearance.  Cardiovascular:     Rate and Rhythm: Normal rate and regular rhythm.  Pulmonary:     Effort: Pulmonary effort is normal.     Breath sounds: Normal breath sounds.  Musculoskeletal:        General: Normal range of motion.  Skin:    General: Skin is warm and dry.  Neurological:     Mental Status: She is alert.  Psychiatric:        Mood and Affect: Mood normal.  Behavior: Behavior normal.    BP 106/71 (BP Location: Left Arm, Patient Position: Sitting, Cuff Size: Large)   Pulse 80   Temp 97.9 F (36.6 C) (Temporal)   Ht 5' (1.524 m)   Wt 173 lb (78.5 kg)   LMP 05/31/2024 (Approximate)   SpO2 99%   BMI 33.79 kg/m  Wt Readings from Last 3 Encounters:  06/27/24 173 lb (78.5 kg)  05/09/24 169 lb 9.6 oz (76.9 kg)  02/04/24 160 lb 12.8 oz (72.9 kg)      Lucius Krabbe,  NP

## 2024-08-07 ENCOUNTER — Telehealth: Admitting: Physician Assistant

## 2024-08-07 DIAGNOSIS — B9689 Other specified bacterial agents as the cause of diseases classified elsewhere: Secondary | ICD-10-CM | POA: Diagnosis not present

## 2024-08-07 DIAGNOSIS — J028 Acute pharyngitis due to other specified organisms: Secondary | ICD-10-CM

## 2024-08-07 MED ORDER — AMOXICILLIN 500 MG PO CAPS: 500.0000 mg | ORAL_CAPSULE | Freq: Two times a day (BID) | ORAL | 0 refills | Status: AC

## 2024-08-07 NOTE — Patient Instructions (Signed)
 [name], thank you for joining [name], PA-C for today's virtual visit.  While this provider is not your primary care provider (PCP), if your PCP is located in our provider database this encounter information will be shared with them immediately following your visit.   A [facility] MyChart account gives you access to today's visit and all your visits, tests, and labs performed at [facility]  click here if you don't have a [facility] MyChart account or go to [email or web address]  Consent: (Patient) [name] provided verbal consent for this virtual visit at the beginning of the encounter.  Current Medications:  Current Outpatient Medications:    amoxicillin  (AMOXIL ) 500 MG capsule, Take 1 capsule (500 mg total) by mouth 2 (two) times daily for 10 days., Disp: 20 capsule, Rfl: 0   busPIRone  (BUSPAR ) 5 MG tablet, Take 1 tablet (5 mg total) by mouth as directed. Take at least 1 time daily up to 3 times as needed., Disp: 60 tablet, Rfl: 1   hydrOXYzine  (ATARAX ) 10 MG tablet, Take 1 tablet (10 mg total) by mouth 3 (three) times daily as needed for anxiety (May cause drowsiness.). Start with 1 pill in the morning and then add as needed. Or can take after work to wind down. (Patient not taking: Reported on 06/27/2024), Disp: 30 tablet, Rfl: 0   LORazepam  (ATIVAN ) 0.5 MG tablet, Take 1-2 tablets (0.5-1 mg total) by mouth daily as needed for anxiety., Disp: 6 tablet, Rfl: 0   norelgestromin -ethinyl estradiol  (XULANE) 150-35 MCG/24HR transdermal patch, Place 1 patch onto the skin once a week., Disp: 3 patch, Rfl: 12   Medications ordered in this encounter:  Meds ordered this encounter  Medications   amoxicillin  (AMOXIL ) 500 MG capsule    Sig: Take 1 capsule (500 mg total) by mouth 2 (two) times daily for 10 days.    Dispense:  20 capsule    Refill:  0    Supervising Provider:   BLAISE ALEENE KIDD [[id number]]     *If you need refills on other medications  prior to your next appointment, please contact your pharmacy*  Follow-Up: Call back or seek an in-person evaluation if the symptoms worsen or if the condition fails to improve as anticipated.  [facility] [phone]  Other Instructions Pharyngitis  Pharyngitis is inflammation of the throat (pharynx). It is a very common cause of sore throat. Pharyngitis can be caused by a bacteria, but it is usually caused by a virus. Most cases of pharyngitis get better on their own without treatment. What are the causes? This condition may be caused by: Infection by viruses (viral). Viral pharyngitis spreads easily from person to person (is contagious) through coughing, sneezing, and sharing of personal items or utensils such as cups, forks, spoons, and toothbrushes. Infection by bacteria (bacterial). Bacterial pharyngitis may be spread by touching the nose or face after coming in contact with the bacteria, or through close contact, such as kissing. Allergies. Allergies can cause buildup of mucus in the throat (post-nasal drip), leading to inflammation and irritation. Allergies can also cause blocked nasal passages, forcing breathing through the mouth, which dries and irritates the throat. What increases the risk? You are more likely to develop this condition if: You are 24-47 years old. You are exposed to crowded environments such as daycare, school, or dormitory living. You live in a cold climate. You have a weakened disease-fighting (immune) system. What are the signs or symptoms? Symptoms of this condition  vary by the cause. Common symptoms of this condition include: Sore throat. Fatigue. Low-grade fever. Stuffy nose (nasal congestion) and cough. Headache. Other symptoms may include: Glands in the neck (lymph nodes) that are swollen. Skin rashes. Plaque-like film on the throat or tonsils. This is often a symptom of bacterial pharyngitis. Vomiting. Red, itchy eyes  (conjunctivitis). Loss of appetite. Joint pain and muscle aches. Enlarged tonsils. How is this diagnosed? This condition may be diagnosed based on your medical history and a physical exam. Your health care provider will ask you questions about your illness and your symptoms. A swab of your throat may be done to check for bacteria (rapid strep test). Other lab tests may also be done, depending on the suspected cause, but these are rare. How is this treated? Many times, treatment is not needed for this condition. Pharyngitis usually gets better in 3-4 days without treatment. Bacterial pharyngitis may be treated with antibiotic medicines. Follow these instructions at home: Medicines Take over-the-counter and prescription medicines only as told by your health care provider. If you were prescribed an antibiotic medicine, take it as told by your health care provider. Do not stop taking the antibiotic even if you start to feel better. Use throat sprays to soothe your throat as told by your health care provider. Children can get pharyngitis. Do not give your child aspirin because of the association with Reye's syndrome. Managing pain To help with pain, try: Sipping warm liquids, such as broth, herbal tea, or warm water. Eating or drinking cold or frozen liquids, such as frozen ice pops. Gargling with a mixture of salt and water 3-4 times a day or as needed. To make salt water, completely dissolve -1 tsp (3-6 g) of salt in 1 cup (237 mL) of warm water. Sucking on hard candy or throat lozenges. Putting a cool-mist humidifier in your bedroom at night to moisten the air. Sitting in the bathroom with the door closed for 5-10 minutes while you run hot water in the shower.  General instructions  Do not use any products that contain nicotine or tobacco. These products include cigarettes, chewing tobacco, and vaping devices, such as e-cigarettes. If you need help quitting, ask your health care  provider. Rest as told by your health care provider. Drink enough fluid to keep your urine pale yellow. How is this prevented? To help prevent becoming infected or spreading infection: Wash your hands often with soap and water for at least 20 seconds. If soap and water are not available, use hand sanitizer. Do not touch your eyes, nose, or mouth with unwashed hands, and wash hands after touching these areas. Do not share cups or eating utensils. Avoid close contact with people who are sick. Contact a health care provider if: You have large, tender lumps in your neck. You have a rash. You cough up green, yellow-brown, or bloody mucus. Get help right away if: Your neck becomes stiff. You drool or are unable to swallow liquids. You cannot drink or take medicines without vomiting. You have severe pain that does not go away, even after you take medicine. You have trouble breathing, and it is not caused by a stuffy nose. You have new pain and swelling in your joints such as the knees, ankles, wrists, or elbows. These symptoms may represent a serious problem that is an emergency. Do not wait to see if the symptoms will go away. Get medical help right away. Call your local emergency services (911 in the [place] Do not drive  yourself to the hospital. Summary Pharyngitis is redness, pain, and swelling (inflammation) of the throat (pharynx). While pharyngitis can be caused by a bacteria, the most common causes are viral. Most cases of pharyngitis get better on their own without treatment. Bacterial pharyngitis is treated with antibiotic medicines. This information is not intended to replace advice given to you by your health care provider. Make sure you discuss any questions you have with your health care provider. Document Revised: 02/05/2021 Document Reviewed: 02/05/2021 Elsevier Patient Education  2024 Elsevier Inc.   If you have been instructed to have an in-person evaluation today at a local  Urgent Care facility, please use the link below. It will take you to a list of all of our available [facility], including address, phone number and hours of operation. Please do not delay care.  [facility]  If you or a family member do not have a primary care provider, use the link below to schedule a visit and establish care. When you choose a [facility] primary care physician or advanced practice provider, you gain a long-term partner in health. Find a Primary Care Provider  Learn more about [facility]'s in-office and virtual care options: [facility] - Get Care Now

## 2024-08-07 NOTE — Progress Notes (Signed)
 Virtual Visit Consent   [name], you are scheduled for a virtual visit with a [facility] provider today. Just as with appointments in the office, your consent must be obtained to participate. Your consent will be active for this visit and any virtual visit you may have with one of our providers in the next 365 days. If you have a MyChart account, a copy of this consent can be sent to you electronically.  As this is a virtual visit, video technology does not allow for your provider to perform a traditional examination. This may limit your provider's ability to fully assess your condition. If your provider identifies any concerns that need to be evaluated in person or the need to arrange testing (such as labs, EKG, etc.), we will make arrangements to do so. Although advances in technology are sophisticated, we cannot ensure that it will always work on either your end or our end. If the connection with a video visit is poor, the visit may have to be switched to a telephone visit. With either a video or telephone visit, we are not always able to ensure that we have a secure connection.  By engaging in this virtual visit, you consent to the provision of healthcare and authorize for your insurance to be billed (if applicable) for the services provided during this visit. Depending on your insurance coverage, you may receive a charge related to this service.  I need to obtain your verbal consent now. Are you willing to proceed with your visit today? [name] has provided verbal consent on 08/07/2024 for a virtual visit (video or telephone). [name] CHRISTELLA Dickinson, PA-C  Date: 08/07/2024 2:26 PM   Virtual Visit via Video Note   I, [name], connected with  [name]  ([id number], September 01, 1986) on 08/07/24 at  2:15 PM EDT by a video-enabled telemedicine application and verified that I am speaking with the correct person using two identifiers.  Location: Patient: Virtual Visit  Location Patient: Home Provider: Virtual Visit Location Provider: Home Office   I discussed the limitations of evaluation and management by telemedicine and the availability of in person appointments. The patient expressed understanding and agreed to proceed.    History of Present Illness: [name] is a 38 y.o. who identifies as a female who was assigned female at birth, and is being seen today for sore throat.  HPI: Sore Throat  This is a new problem. The current episode started in the past 7 days (Saturday morning, 08/05/24). The problem has been gradually worsening. There has been no fever. The pain is moderate. Associated symptoms include congestion, coughing, diarrhea, headaches, a hoarse voice, swollen glands (right) and trouble swallowing. Pertinent negatives include no ear discharge, ear pain, plugged ear sensation, shortness of breath or vomiting. She has had no exposure to strep or mono. Treatments tried: dayquil, hot tea, mucinex sinus. The treatment provided no relief.     Problems:  Patient Active Problem List   Diagnosis Date Noted   Generalized anxiety disorder 06/08/2024   Class 1 obesity due to excess calories without serious comorbidity with body mass index (BMI) of 33.0 to 33.9 in adult 05/09/2024   Encounter for prescription for transdermal contraceptive 12/25/2021    Allergies: No Known Allergies Medications:  Current Outpatient Medications:    amoxicillin  (AMOXIL ) 500 MG capsule, Take 1 capsule (500 mg total) by mouth 2 (two) times daily for 10 days., Disp: 20 capsule, Rfl: 0   busPIRone  (BUSPAR ) 5  MG tablet, Take 1 tablet (5 mg total) by mouth as directed. Take at least 1 time daily up to 3 times as needed., Disp: 60 tablet, Rfl: 1   hydrOXYzine  (ATARAX ) 10 MG tablet, Take 1 tablet (10 mg total) by mouth 3 (three) times daily as needed for anxiety (May cause drowsiness.). Start with 1 pill in the morning and then add as needed. Or can take after work to wind  down. (Patient not taking: Reported on 06/27/2024), Disp: 30 tablet, Rfl: 0   LORazepam  (ATIVAN ) 0.5 MG tablet, Take 1-2 tablets (0.5-1 mg total) by mouth daily as needed for anxiety., Disp: 6 tablet, Rfl: 0   norelgestromin -ethinyl estradiol  (XULANE) 150-35 MCG/24HR transdermal patch, Place 1 patch onto the skin once a week., Disp: 3 patch, Rfl: 12  Observations/Objective: Patient is well-developed, well-nourished in no acute distress.  Resting comfortably at home.  Head is normocephalic, atraumatic.  No labored breathing.  Speech is clear and coherent with logical content.  Patient is alert and oriented at baseline.    Assessment and Plan: 1. Acute bacterial pharyngitis (Primary) - amoxicillin  (AMOXIL ) 500 MG capsule; Take 1 capsule (500 mg total) by mouth 2 (two) times daily for 10 days.  Dispense: 20 capsule; Refill: 0  - Suspect bacterial pharyngitis with laryngitis - Amoxicillin  prescribed - Tylenol  and Ibuprofen  alternating every 4 hours - Salt water gargles - Chloraseptic spray - Liquid and soft food diet - Push fluids - Voice rest - New toothbrush in 3 days - Seek in person evaluation if not improving or if symptoms worsen   Follow Up Instructions: I discussed the assessment and treatment plan with the patient. The patient was provided an opportunity to ask questions and all were answered. The patient agreed with the plan and demonstrated an understanding of the instructions.  A copy of instructions were sent to the patient via MyChart unless otherwise noted below.    The patient was advised to call back or seek an in-person evaluation if the symptoms worsen or if the condition fails to improve as anticipated.    [name] CHRISTELLA Dickinson, PA-C

## 2024-09-27 DIAGNOSIS — F4310 Post-traumatic stress disorder, unspecified: Secondary | ICD-10-CM | POA: Diagnosis not present

## 2024-10-27 ENCOUNTER — Telehealth: Admitting: Family Medicine

## 2024-10-27 NOTE — Progress Notes (Signed)
 Pt did not show up for visit-[facility]B
